# Patient Record
Sex: Female | Born: 1970 | Race: White | Hispanic: No | Smoking: Never smoker
Health system: Southern US, Community
[De-identification: ages and names within clinical notes are randomized; demographics above are authoritative.]

## PROBLEM LIST (undated history)

## (undated) DIAGNOSIS — F419 Anxiety disorder, unspecified: Secondary | ICD-10-CM

## (undated) DIAGNOSIS — I639 Cerebral infarction, unspecified: Secondary | ICD-10-CM

## (undated) DIAGNOSIS — I10 Essential (primary) hypertension: Secondary | ICD-10-CM

## (undated) HISTORY — PX: ABDOMINAL HYSTERECTOMY: SHX81

## (undated) HISTORY — PX: TONSILLECTOMY: SUR1361

## (undated) HISTORY — PX: LEEP: SHX91

## (undated) HISTORY — DX: Cerebral infarction, unspecified: I63.9

## (undated) HISTORY — PX: CHOLECYSTECTOMY: SHX55

## (undated) HISTORY — PX: FRACTURE SURGERY: SHX138

---

## 2012-02-17 ENCOUNTER — Ambulatory Visit: Payer: Self-pay

## 2013-04-21 ENCOUNTER — Ambulatory Visit: Payer: Self-pay

## 2013-07-06 ENCOUNTER — Ambulatory Visit: Payer: Self-pay | Admitting: Unknown Physician Specialty

## 2013-07-08 ENCOUNTER — Observation Stay: Payer: Self-pay | Admitting: Surgery

## 2013-07-08 LAB — PREGNANCY, URINE: Pregnancy Test, Urine: NEGATIVE m[IU]/mL

## 2013-07-12 LAB — PATHOLOGY REPORT

## 2014-04-25 ENCOUNTER — Ambulatory Visit: Payer: Self-pay

## 2014-05-05 DIAGNOSIS — B009 Herpesviral infection, unspecified: Secondary | ICD-10-CM

## 2014-05-05 DIAGNOSIS — E669 Obesity, unspecified: Secondary | ICD-10-CM | POA: Insufficient documentation

## 2014-05-05 HISTORY — DX: Herpesviral infection, unspecified: B00.9

## 2015-03-02 ENCOUNTER — Other Ambulatory Visit: Payer: Self-pay | Admitting: Obstetrics and Gynecology

## 2015-03-02 DIAGNOSIS — Z1231 Encounter for screening mammogram for malignant neoplasm of breast: Secondary | ICD-10-CM

## 2015-03-09 NOTE — Op Note (Signed)
PATIENT NAME:  Kristina [name], Kristina [name] MR#:  [id number] DATE OF BIRTH:  03-20-1971  DATE OF PROCEDURE:  07/08/2013  PREOPERATIVE DIAGNOSES: Acute cholecystitis, cholelithiasis.   POSTOPERATIVE DIAGNOSES:  Acute cholecystitis, cholelithiasis.    PROCEDURE: Laparoscopic cholecystectomy.   SURGEON: Renda RollsWilton Smith, M.D.   ANESTHESIA: General.   INDICATIONS: This 44 year old female came in with severe right upper quadrant abdominal pain, ultrasound findings of gallstones and thickened gallbladder wall. She had exquisite right upper quadrant tenderness and surgery was recommended for definitive treatment.   DESCRIPTION OF PROCEDURE: The patient was placed on the operating table in the supine position under general endotracheal anesthesia. The abdomen was prepared with ChloraPrep and draped in a sterile manner.   A short infraumbilical incision was made, carried down through subcutaneous tissues. The deep fascia was grasped with laryngeal hook and elevated. A Veress needle was inserted, aspirated and irrigated with a saline solution. Next, the peritoneal cavity was inflated with carbon dioxide. The Veress needle was removed. The 10 mm cannula was inserted. The 10 mm, 0 degree laparoscope was inserted to view the peritoneal cavity. Another incision was made in the epigastrium slightly to the right of the midline to introduce an 11 mm cannula. Two incisions were made in the lateral aspect of the right upper quadrant to introduce two 5 mm cannulas.   Initial inspection revealed there was marked distention of the gallbladder and it appeared tense. The liver itself appeared normal. There were no other gross abnormalities seen. There was just a trace of ascites. Next, the gallbladder was decompressed with a lancing needle, however, only a limited amount of black, thick bile came out and removed enough that the gallbladder could then be grasped with a grasper and was retracted towards the right shoulder. A number of acute  adhesions were taken down with blunt dissection. The pouch of Jon BillingsMorrison was retracted inferiorly and laterally. The porta hepatis was demonstrated. The cystic duct was dissected free from surrounding structures. It was somewhat large in size but could see it as it tapered off the gallbladder. The neck of the gallbladder was further mobilized with incision of the visceral peritoneum. The tissues were friable. Multiple small bleeding points were cauterized. The cystic artery was dissected free from surrounding structures. A critical view of safety was demonstrated. An Endo Clip was placed across the cystic duct adjacent to the neck of the gallbladder. An incision was made in the cystic duct to introduce a Reddick catheter, however, the catheter would not thread beyond some 6 mm.  The catheter was removed and I used a microdissector to milk the duct towards the incision and a small stone came out. Subsequently, another effort was made to insert the Reddick catheter, however, it would not thread. No additional stones could be milked out. It appeared that the cystic duct was obstructed and, therefore, cholangiogram was not done. The cystic duct was doubly ligated with Endo Clips and divided. The cystic artery was controlled with double Endo Clips and divided. The gallbladder was dissected, mobilizing it from the liver with a somewhat tedious dissection. There was another small bleeding point laterally located which was controlled with Endo Clip.  I continued, very tedious dissection was undertaken as the gallbladder was somewhat long and was markedly inflamed with thickened gallbladder wall. A smooth plane of dissection was followed. Multiple  tiny vessels were cauterized and about 2/3 of the way along the gallbladder bed did have some leakage of bile which was black in color and suctioned this  as it exuded from the gallbladder. Once this stopped draining, the gallbladder was grasped at this site with a grasper. Further  tedious dissection was carried out, cauterizing multiple tiny bleeding points, and eventually the gallbladder was completely separated from the liver. The site was irrigated with heparinized saline solution and aspirated. Hemostasis appeared to be intact. The gallbladder was placed into an Endo Catch bag and brought up to the umbilical incision.  It was necessary to lengthen the skin incision ultimately by about 1.5 cm and also lengthen the fascial incision sharply with a scalpel and with opening the bag and incising the gallbladder, additional black thick bile was aspirated. Stone forceps was used to remove a number of stones and with additional traction and manipulation, the gallbladder was removed still in the bag and submitted in formalin for routine pathology.   Next, a 0 Maxon figure-of-eight suture was placed in the fascia to make the opening smaller so that the 10 mm cannula was then reinserted. The laparoscope was inserted through this site, further insufflated the abdomen and then re-examined the operative site.  One tiny bleeding point was cauterized. Hemostasis otherwise appeared to be intact. The site was irrigated with heparinized saline multiple times until the return was clear and colorless. Hemostasis was intact. Next, the cannulas were removed allowing carbon dioxide to escape from the peritoneal cavity. The remainder of the fascial defect at the umbilicus was closed with another 0 Maxon figure-of-eight suture. The wounds were inspected. Hemostasis was intact. The wounds were closed with interrupted 5-0 chromic subcuticular suture, benzoin and Steri-Strips. Dressings were applied with paper tape. The patient tolerated surgery satisfactorily.   This case was significantly more difficult and required 2 hours of operating time and required more effort than the typical gallbladder operation due to the marked distention and friability and extent of size of the gallbladder. The patient was carried to  the recovery room for postoperative care.      ____________________________ J. Renda Rolls, MD jws:cs D: 07/08/2013 20:13:51 ET T: 07/08/2013 21:28:58 ET JOB#: [id number]  cc: Adella Hare, MD, <Dictator> Adella Hare MD ELECTRONICALLY SIGNED 07/12/2013 19:09

## 2015-03-09 NOTE — H&P (Signed)
PATIENT NAME:  Kristina [name], Kristina [name] MR#:  [id number] DATE OF BIRTH:  11-Aug-1971  DATE OF ADMISSION:  07/08/2013  HISTORY OF PRESENT ILLNESS:  This 44 year old female comes in with the chief complaint of right upper abdominal pain. She reports acute onset of pain 6 days ago which came on as an attack with severe pain radiating around to the right back. She had associated nausea. No vomiting. No chills or fever. She reports she had another attack 3 days ago, but has continued to hurt, has been taking hydrocodone, has had an ultrasound which demonstrated gallstones with a stone impacted in the neck of the gallbladder. Bile duct was normal size. She has been taking hydrocodone, which eases the pain somewhat but still hurting. Has been able to take some liquids and food. Has not had anything to eat today. She is moving slowly, in quite a bit of distress.   PAST MEDICAL HISTORY: She has no history of hepatitis. Does have history of herpes simplex infection. Has had LEEP procedure. Also has had NovaSure. Says if she has any menstrual period at all just lasts for about 10 minutes. She reports no history of heart disease. No history of any lung problems.   SOCIAL HISTORY: She is accompanied by her husband. Does not smoke. Does not drink any alcohol.   FAMILY HISTORY: Positive for diabetes, clotting and bleeding problems in her mother, although the patient has had no clotting or bleeding problems herself. Mother has had throat cancer.   REVIEW OF SYSTEMS: She reports no other recent acute illness such as cough, cold, sore throat. No chills or fever. No difficulty breathing. No chest pain, except with the present illness. Some discomfort in the chest. She reports she moves her bowels satisfactorily, voids satisfactorily. She has had no ankle edema. Has had no recent sores or boils. Review of systems otherwise negative.   PHYSICAL EXAMINATION: GENERAL: She is awake, alert and oriented, ambulatory, no acute  distress. VITAL SIGNS: Weight is 184, blood pressure 99/67, pulse 82.  SKIN: Warm and dry without rash or jaundice.  HEENT: Pupils equal and reactive to light. Extraocular movements are intact. Sclerae clear. Pharynx clear.  NECK: No palpable mass.  LUNGS: Sounds are clear.  HEART: Regular rhythm, S1 and S2.  ABDOMEN: Exquisite right upper quadrant tenderness. No palpable mass. No lower abdominal tenderness.  EXTREMITIES: No dependent edema.  NEUROLOGIC: Awake, alert and oriented, moving all extremities.   IMPRESSION: Acute cholecystitis.   RECOMMENDATIONS: I recommend we do CBC, Met-B, liver panel, admit her to observation bed in the hospital, recommend laparoscopic cholecystectomy. I have discussed the operation, care, risks and benefits with her in detail information, reviewed with her patient information and illustration booklet.  So we are going to arrange for the observation bed, see if we get the surgery scheduled later today. ____________________________ J. Rochel Brome, MD jws:sb D: 07/08/2013 08:38:53 ET T: 07/08/2013 09:14:29 ET JOB#: [id number]  cc: Loreli Dollar, MD, <Dictator> (FAX: [phone]) Loreli Dollar MD ELECTRONICALLY SIGNED 07/08/2013 20:58

## 2015-04-03 NOTE — H&P (Signed)
  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 LEEP in 2011 and recurrence on pap 2016 shows LGSIL , cannot r/out HGSIL .A repeat colpo and bx show CIN1. 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  Past Surgical History:  has past surgical history that includes ankle reconstruction (1993); Cervical biopsy w/ loop electrode excision; Endometrial ablation w/ novasure; Cholecystectomy; NovaSure procedure (02/21/08); and LEEP procedure. Family History: family history includes Breast cancer in her other; Diabetes mellitus in her father. Social History:  reports that she has never smoked. She does not have any smokeless tobacco history on file. She reports that she does not drink alcohol or use illicit drugs. OB/GYN History:  OB History    Gravida Para Term Preterm AB TAB SAB Ectopic Multiple Living   4 4 4       4       Allergies: has No Known Allergies. Medications:  Current outpatient prescriptions:  . phentermine (ADIPEX-P) 37.5 MG capsule, Take 1 capsule (37.5 mg total) by mouth every morning before breakfast., Disp: 30 capsule, Rfl: 0 . valACYclovir (VALTREX) 500 MG tablet, Take 1 tablet (500 mg total) by mouth once daily., Disp: 30 tablet, Rfl: 3  Review of Systems: General: No fatigue or weight loss Eyes:No vision changes Ears:No hearing difficulty Respiratory: No cough or shortness of breath Pulmonary: No asthma or shortness of breath Cardiovascular: No chest pain, palpitations, dyspnea on exertion Gastrointestinal: No abdominal bloating, chronic diarrhea, constipations, masses, pain or hematochezia Genitourinary:No hematuria, dysuria, abnormal vaginal  discharge, pelvic pain, Menometrorrhagia Lymphatic:No swollen lymph nodes Musculoskeletal:No muscle weakness Neurologic:No extremity weakness, syncope, seizure disorder Psychiatric:No history of depression, delusions or suicidal/homicidal ideation   Exam:   Filed Vitals:   01/22/15 1658  BP: 113/76  Pulse: 84    Body mass index is 28.9 kg/(m^2).   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 [facility] cons of tvh discussed with the pt. She is aware of potential organ injury , risks of blood transfusion and the potential for infectious diseases including hepatitis , HIV , and cmv if blood is given Risks of DVT / PE and infection were discussed . Pt understands and wishes to proceed  . All questions answered .      Vilma PraderHOMAS JANSE Kristina Bullard, MD            Facility Information    [facility]   [address] [place] [id number]    Service Location

## 2015-04-05 ENCOUNTER — Encounter
Admission: RE | Admit: 2015-04-05 | Discharge: 2015-04-05 | Disposition: A | Discharge status: Home / Self Care | Payer: BC Managed Care – PPO | Source: Ambulatory Visit | Attending: Obstetrics and Gynecology | Admitting: Obstetrics and Gynecology

## 2015-04-05 DIAGNOSIS — Z79899 Other long term (current) drug therapy: Secondary | ICD-10-CM | POA: Diagnosis not present

## 2015-04-05 DIAGNOSIS — E669 Obesity, unspecified: Secondary | ICD-10-CM | POA: Insufficient documentation

## 2015-04-05 DIAGNOSIS — Z9889 Other specified postprocedural states: Secondary | ICD-10-CM | POA: Diagnosis not present

## 2015-04-05 DIAGNOSIS — Z6828 Body mass index (BMI) 28.0-28.9, adult: Secondary | ICD-10-CM | POA: Diagnosis not present

## 2015-04-05 DIAGNOSIS — Z803 Family history of malignant neoplasm of breast: Secondary | ICD-10-CM | POA: Diagnosis not present

## 2015-04-05 DIAGNOSIS — N879 Dysplasia of cervix uteri, unspecified: Secondary | ICD-10-CM | POA: Diagnosis present

## 2015-04-05 DIAGNOSIS — Z01812 Encounter for preprocedural laboratory examination: Secondary | ICD-10-CM | POA: Diagnosis not present

## 2015-04-05 DIAGNOSIS — N870 Mild cervical dysplasia: Secondary | ICD-10-CM | POA: Diagnosis not present

## 2015-04-05 DIAGNOSIS — Z833 Family history of diabetes mellitus: Secondary | ICD-10-CM | POA: Diagnosis not present

## 2015-04-05 LAB — BASIC METABOLIC PANEL
Anion gap: 6 (ref 5–15)
BUN: 11 mg/dL (ref 6–20)
CO2: 26 mmol/L (ref 22–32)
Calcium: 8.8 mg/dL — ABNORMAL LOW (ref 8.9–10.3)
Chloride: 105 mmol/L (ref 101–111)
Creatinine, Ser: 0.64 mg/dL (ref 0.44–1.00)
GFR calc Af Amer: >60 mL/min (ref >60)
GLUCOSE: 94 mg/dL (ref 65–99)
Potassium: 4.1 mmol/L (ref 3.5–5.1)
Sodium: 137 mmol/L (ref 135–145)

## 2015-04-05 LAB — HEMOGLOBIN: Hemoglobin: 12.3 g/dL (ref 12.0–16.0)

## 2015-04-05 LAB — ABO/RH: ABO/RH(D): A NEG

## 2015-04-05 LAB — TYPE AND SCREEN
ABO/RH(D): A NEG
ANTIBODY SCREEN: NEGATIVE

## 2015-04-05 NOTE — Patient Instructions (Signed)
  Your procedure is scheduled on: Apr 13, 2015 (Friday) Report to Same Day Surgery. To find out your arrival time please call [phone] between 1PM - 3PM on Apr 12, 2015 (Thursday) Remember: Instructions that are not followed completely may result in serious medical risk, up to and including death, or upon the discretion of your surgeon and anesthesiologist your surgery may need to be rescheduled.    ___x_ 1. Do not eat food or drink liquids after midnight. No gum chewing or hard candies.     ____ 2. No Alcohol for 24 hours before or after surgery.   ____ 3. Bring all medications with you on the day of surgery if instructed.    __x__ 4. Notify your doctor if there is any change in your medical condition     (cold, fever, infections).     Do not wear jewelry, make-up, hairpins, clips or nail polish.  Do not wear lotions, powders, or perfumes. You may wear deodorant.  Do not shave 48 hours prior to surgery. Men may shave face and neck.  Do not bring valuables to the hospital.    [facility] is not responsible for any belongings or valuables.               Contacts, dentures or bridgework may not be worn into surgery.  Leave your suitcase in the car. After surgery it may be brought to your room.  For patients admitted to the hospital, discharge time is determined by your                treatment team.   Patients discharged the day of surgery will not be allowed to drive home.   Please read over the following fact sheets that you were given:   Surgical Site Infection Prevention   ____ Take these medicines the morning of surgery with A SIP OF WATER:    1.  2.   3.   4.  5.  6.  __x__ Fleet Enema (as directed)   __x__ Use CHG Soap as directed  ____ Use inhalers on the day of surgery  ____ Stop metformin 2 days prior to surgery    ____ Take 1/2 of usual insulin dose the night before surgery and none on the morning of surgery.   ____ Stop Coumadin/Plavix/aspirin on    ____ Stop Anti-inflammatories on    ____ Stop supplements until after surgery.    ____ Bring C-Pap to the hospital.

## 2015-04-13 ENCOUNTER — Observation Stay
Admission: RE | Admit: 2015-04-13 | Discharge: 2015-04-14 | Disposition: A | Discharge status: Home / Self Care | Payer: BC Managed Care – PPO | Source: Ambulatory Visit | Attending: Obstetrics and Gynecology | Admitting: Obstetrics and Gynecology

## 2015-04-13 ENCOUNTER — Ambulatory Visit: Payer: BC Managed Care – PPO | Admitting: Anesthesiology

## 2015-04-13 ENCOUNTER — Encounter
Admission: RE | Disposition: A | Discharge status: Home / Self Care | Payer: Self-pay | Source: Ambulatory Visit | Attending: Obstetrics and Gynecology

## 2015-04-13 DIAGNOSIS — Z9889 Other specified postprocedural states: Secondary | ICD-10-CM | POA: Insufficient documentation

## 2015-04-13 DIAGNOSIS — N870 Mild cervical dysplasia: Secondary | ICD-10-CM | POA: Diagnosis not present

## 2015-04-13 DIAGNOSIS — Z833 Family history of diabetes mellitus: Secondary | ICD-10-CM | POA: Insufficient documentation

## 2015-04-13 DIAGNOSIS — Z803 Family history of malignant neoplasm of breast: Secondary | ICD-10-CM | POA: Insufficient documentation

## 2015-04-13 DIAGNOSIS — E669 Obesity, unspecified: Secondary | ICD-10-CM | POA: Insufficient documentation

## 2015-04-13 DIAGNOSIS — Z6828 Body mass index (BMI) 28.0-28.9, adult: Secondary | ICD-10-CM | POA: Insufficient documentation

## 2015-04-13 DIAGNOSIS — Z79899 Other long term (current) drug therapy: Secondary | ICD-10-CM | POA: Insufficient documentation

## 2015-04-13 HISTORY — PX: SALPINGOOPHORECTOMY: SHX82

## 2015-04-13 HISTORY — PX: VAGINAL HYSTERECTOMY: SHX2639

## 2015-04-13 LAB — POCT PREGNANCY, URINE: PREG TEST UR: NEGATIVE

## 2015-04-13 SURGERY — HYSTERECTOMY, VAGINAL
Anesthesia: General

## 2015-04-13 MED ORDER — ACETAMINOPHEN 10 MG/ML IV SOLN
INTRAVENOUS | Status: DC | PRN
  Administered 2015-04-13: 1000 mg via INTRAVENOUS

## 2015-04-13 MED ORDER — KETOROLAC TROMETHAMINE 30 MG/ML IJ SOLN
30.0000 mg | Freq: Three times a day (TID) | INTRAMUSCULAR | Status: DC
  Administered 2015-04-13 (×2): 30 mg via INTRAVENOUS
  Filled 2015-04-13 (×2): qty 1

## 2015-04-13 MED ORDER — MORPHINE SULFATE 2 MG/ML IJ SOLN
1.0000 mg | INTRAMUSCULAR | Status: DC | PRN
  Administered 2015-04-13 (×3): 2 mg via INTRAVENOUS
  Filled 2015-04-13 (×3): qty 1

## 2015-04-13 MED ORDER — ONDANSETRON HCL 4 MG/2ML IJ SOLN
INTRAMUSCULAR | Status: DC | PRN
  Administered 2015-04-13: 4 mg via INTRAVENOUS

## 2015-04-13 MED ORDER — LIDOCAINE HCL (CARDIAC) 20 MG/ML IV SOLN
INTRAVENOUS | Status: DC | PRN
  Administered 2015-04-13: 100 mg via INTRAVENOUS

## 2015-04-13 MED ORDER — ACETAMINOPHEN 10 MG/ML IV SOLN
INTRAVENOUS | Status: AC
  Filled 2015-04-13: qty 100

## 2015-04-13 MED ORDER — FENTANYL CITRATE (PF) 100 MCG/2ML IJ SOLN
INTRAMUSCULAR | Status: AC
  Administered 2015-04-13: 25 ug via INTRAVENOUS
  Filled 2015-04-13: qty 2

## 2015-04-13 MED ORDER — ROCURONIUM BROMIDE 100 MG/10ML IV SOLN
INTRAVENOUS | Status: DC | PRN
  Administered 2015-04-13: 40 mg via INTRAVENOUS
  Administered 2015-04-13: 10 mg via INTRAVENOUS

## 2015-04-13 MED ORDER — MIDAZOLAM HCL 2 MG/2ML IJ SOLN
INTRAMUSCULAR | Status: DC | PRN
  Administered 2015-04-13: 2 mg via INTRAVENOUS

## 2015-04-13 MED ORDER — ONDANSETRON HCL 4 MG/2ML IJ SOLN: 4.0000 mg | Freq: Once | INTRAMUSCULAR | Status: DC | PRN

## 2015-04-13 MED ORDER — LACTATED RINGERS IV SOLN
INTRAVENOUS | Status: DC
  Administered 2015-04-13 (×2): via INTRAVENOUS

## 2015-04-13 MED ORDER — POTASSIUM CHLORIDE 2 MEQ/ML IV SOLN
INTRAVENOUS | Status: DC
  Administered 2015-04-13: 13:00:00 via INTRAVENOUS
  Filled 2015-04-13 (×6): qty 1000

## 2015-04-13 MED ORDER — KETOROLAC TROMETHAMINE 30 MG/ML IJ SOLN
INTRAMUSCULAR | Status: DC | PRN
Start: 2015-04-13 — End: 2015-04-13

## 2015-04-13 MED ORDER — OXYCODONE-ACETAMINOPHEN 5-325 MG PO TABS
2.0000 | ORAL_TABLET | ORAL | Status: DC | PRN
  Administered 2015-04-13 – 2015-04-14 (×5): 2 via ORAL
  Filled 2015-04-13 (×6): qty 2

## 2015-04-13 MED ORDER — CEFOXITIN SODIUM-DEXTROSE 2-2.2 GM-% IV SOLR (PREMIX)
INTRAVENOUS | Status: AC
  Filled 2015-04-13: qty 50

## 2015-04-13 MED ORDER — CEFOXITIN SODIUM-DEXTROSE 2-2.2 GM-% IV SOLR (PREMIX)
2.0000 g | INTRAVENOUS | Status: AC
  Administered 2015-04-13: 2000 mg via INTRAVENOUS

## 2015-04-13 MED ORDER — FENTANYL CITRATE (PF) 100 MCG/2ML IJ SOLN
25.0000 ug | INTRAMUSCULAR | Status: DC | PRN
  Administered 2015-04-13 (×4): 25 ug via INTRAVENOUS

## 2015-04-13 MED ORDER — LIDOCAINE-EPINEPHRINE 1 %-1:100000 IJ SOLN
INTRAMUSCULAR | Status: DC | PRN
Start: 2015-04-13 — End: 2015-04-13
  Administered 2015-04-13: 10 mL

## 2015-04-13 MED ORDER — LACTATED RINGERS IV SOLN
INTRAVENOUS | Status: DC
  Administered 2015-04-13: 07:00:00 via INTRAVENOUS

## 2015-04-13 MED ORDER — DEXAMETHASONE SODIUM PHOSPHATE 4 MG/ML IJ SOLN
INTRAMUSCULAR | Status: DC | PRN
  Administered 2015-04-13: 10 mg via INTRAVENOUS

## 2015-04-13 MED ORDER — PROPOFOL 10 MG/ML IV BOLUS
INTRAVENOUS | Status: DC | PRN
  Administered 2015-04-13: 160 mg via INTRAVENOUS
  Administered 2015-04-13: 40 mg via INTRAVENOUS

## 2015-04-13 MED ORDER — SUGAMMADEX SODIUM 200 MG/2ML IV SOLN
INTRAVENOUS | Status: DC | PRN
  Administered 2015-04-13: 180.6 mg via INTRAVENOUS

## 2015-04-13 MED ORDER — PROMETHAZINE HCL 25 MG/ML IJ SOLN: 25.0000 mg | INTRAMUSCULAR | Status: DC | PRN

## 2015-04-13 MED ORDER — PHENYLEPHRINE HCL 10 MG/ML IJ SOLN
INTRAMUSCULAR | Status: DC | PRN
  Administered 2015-04-13: 100 ug via INTRAVENOUS

## 2015-04-13 MED ORDER — LIDOCAINE-EPINEPHRINE 1 %-1:100000 IJ SOLN
INTRAMUSCULAR | Status: AC
  Filled 2015-04-13: qty 1

## 2015-04-13 MED ORDER — FENTANYL CITRATE (PF) 100 MCG/2ML IJ SOLN
INTRAMUSCULAR | Status: DC | PRN
  Administered 2015-04-13: 100 ug via INTRAVENOUS
  Administered 2015-04-13 (×3): 50 ug via INTRAVENOUS

## 2015-04-13 MED ORDER — HYDROCODONE-ACETAMINOPHEN 5-325 MG PO TABS
1.0000 | ORAL_TABLET | ORAL | Status: DC | PRN
Start: 2015-04-13 — End: 2015-04-13
  Administered 2015-04-13: 2 via ORAL
  Filled 2015-04-13: qty 2

## 2015-04-13 MED ORDER — KETOROLAC TROMETHAMINE 30 MG/ML IJ SOLN
INTRAMUSCULAR | Status: DC | PRN
  Administered 2015-04-13: 30 mg via INTRAVENOUS

## 2015-04-13 MED ORDER — FENTANYL CITRATE (PF) 100 MCG/2ML IJ SOLN
INTRAMUSCULAR | Status: AC
  Filled 2015-04-13: qty 2

## 2015-04-13 SURGICAL SUPPLY — 45 items
BAG URO DRAIN 2000ML W/SPOUT (MISCELLANEOUS) ×4 IMPLANT
CANISTER SUCT 1200ML W/VALVE (MISCELLANEOUS) ×4 IMPLANT
CATH FOLEY 2WAY  5CC 16FR (CATHETERS) ×2
CATH ROBINSON RED A/P 16FR (CATHETERS) ×4 IMPLANT
CATH TRAY 16F METER LATEX (MISCELLANEOUS) ×4 IMPLANT
CATH URTH 16FR FL 2W BLN LF (CATHETERS) ×2 IMPLANT
DISSECTOR KITTNER STICK (MISCELLANEOUS) IMPLANT
DISSECTORS/KITTNER STICK (MISCELLANEOUS)
DRAPE LAPAROTOMY TRNSV 106X77 (MISCELLANEOUS) IMPLANT
DRAPE PERI LITHO V/GYN (MISCELLANEOUS) ×4 IMPLANT
DRAPE SURG 17X11 SM STRL (DRAPES) ×8 IMPLANT
DRAPE UNDER BUTTOCK W/FLU (DRAPES) ×4 IMPLANT
DRSG TELFA 3X8 NADH (GAUZE/BANDAGES/DRESSINGS) ×4 IMPLANT
ELECT BLADE 6 FLAT ULTRCLN (ELECTRODE) ×4 IMPLANT
GLOVE BIO SURGEON STRL SZ8 (GLOVE) ×16 IMPLANT
GLOVE INDICATOR 8.0 STRL GRN (GLOVE) ×4 IMPLANT
GOWN STRL REUS W/ TWL LRG LVL3 (GOWN DISPOSABLE) ×6 IMPLANT
GOWN STRL REUS W/ TWL XL LVL3 (GOWN DISPOSABLE) ×2 IMPLANT
GOWN STRL REUS W/TWL LRG LVL3 (GOWN DISPOSABLE) ×6
GOWN STRL REUS W/TWL XL LVL3 (GOWN DISPOSABLE) ×2
IV NS 1000ML (IV SOLUTION) ×2
IV NS 1000ML BAXH (IV SOLUTION) ×2 IMPLANT
KIT RM TURNOVER CYSTO AR (KITS) ×4 IMPLANT
LABEL OR SOLS (LABEL) ×4 IMPLANT
NDL SAFETY 22GX1.5 (NEEDLE) ×4 IMPLANT
PACK BASIN MAJOR ARMC (MISCELLANEOUS) ×4 IMPLANT
PACK BASIN MINOR ARMC (MISCELLANEOUS) ×4 IMPLANT
PAD GROUND ADULT SPLIT (MISCELLANEOUS) ×4 IMPLANT
PAD OB MATERNITY 4.3X12.25 (PERSONAL CARE ITEMS) ×4 IMPLANT
PAD PREP 24X41 OB/GYN DISP (PERSONAL CARE ITEMS) ×4 IMPLANT
SPONGE XRAY 4X4 16PLY STRL (MISCELLANEOUS) ×4 IMPLANT
STAPLER SKIN PROX 35W (STAPLE) ×4 IMPLANT
SUT PDS 2-0 27IN (SUTURE) ×4 IMPLANT
SUT VIC AB 0 CT1 27 (SUTURE) ×4
SUT VIC AB 0 CT1 27XCR 8 STRN (SUTURE) ×4 IMPLANT
SUT VIC AB 0 CT1 36 (SUTURE) ×4 IMPLANT
SUT VIC AB 2-0 CT1 (SUTURE) ×4 IMPLANT
SUT VIC AB 2-0 SH 27 (SUTURE)
SUT VIC AB 2-0 SH 27XBRD (SUTURE) IMPLANT
SUT VICRYL PLUS ABS 0 54 (SUTURE) IMPLANT
SYR BULB IRRIG 60ML STRL (SYRINGE) ×4 IMPLANT
SYR CONTROL 10ML (SYRINGE) ×4 IMPLANT
SYRINGE 10CC LL (SYRINGE) ×4 IMPLANT
TRAY PREP VAG/GEN (MISCELLANEOUS) ×4 IMPLANT
WATER STERILE IRR 1000ML POUR (IV SOLUTION) ×4 IMPLANT

## 2015-04-13 NOTE — Progress Notes (Signed)
Pt seen  Today . No change in history . Hemoglobin 12  , negative HCG .  NPO . Pt ready for TVH possible salpingectomy

## 2015-04-13 NOTE — Progress Notes (Signed)
Day of Surgery Procedure(s) (LRB): HYSTERECTOMY VAGINAL (N/A) SALPINGectomy  Subjective: Patient reports some LBP and pelvic pain  Objective: vss   good urine output.  General: alert and cooperative  Assessment: Clinically stable   Plan: D/c IV  Start regular diet  Foley already out    LOS: 0 days    Kristina [name] 04/13/2015, 6:54 PM

## 2015-04-13 NOTE — Progress Notes (Signed)
Nurse has attempted to control lower back pain with norco, morphine, k-pad, and percocet.  Patient continues to complain of pain.  Patient is alert and oriented sitting up in bed with talking with family member and does not appear to be in distress.  No facial grimacing, grunting, or tension noted.  Dr. Arvella MerlesSchemerhorn paged for further orders.

## 2015-04-13 NOTE — Anesthesia Procedure Notes (Signed)
Procedure Name: Intubation Date/Time: 04/13/2015 7:43 AM Performed by: Stormy FabianURTIS, Jadie Comas Pre-anesthesia Checklist: Patient identified, Patient being monitored, Timeout performed, Emergency Drugs available and Suction available Patient Re-evaluated:Patient Re-evaluated prior to inductionOxygen Delivery Method: Circle system utilized Preoxygenation: Pre-oxygenation with 100% oxygen Intubation Type: IV induction Ventilation: Mask ventilation without difficulty Laryngoscope Size: Mac and 3 Grade View: Grade I Tube type: Oral Tube size: 7.0 mm Number of attempts: 1 Airway Equipment and Method: Stylet Placement Confirmation: ETT inserted through vocal cords under direct vision,  positive ETCO2 and breath sounds checked- equal and bilateral Secured at: 20 cm Tube secured with: Tape Dental Injury: Teeth and Oropharynx as per pre-operative assessment

## 2015-04-13 NOTE — Brief Op Note (Signed)
04/13/2015  8:43 AM  PATIENT:  Kristina [name]  44 y.o. female  PRE-OPERATIVE DIAGNOSIS:  persistant cervical dysplasia  POST-OPERATIVE DIAGNOSIS:  same as preop  PROCEDURE:  Procedure(s): HYSTERECTOMY VAGINAL (N/A) SALPINGO OOPHORECTOMY (Bilateral)  SURGEON:  Surgeon(s) and Role:    Suzy Bouchard* Thomas J Schermerhorn, MD - Primary  PHYSICIAN ASSISTANT: beasley  ASSISTANTS: none   ANESTHESIA:   general  EBL:  Total I/O In: -  Out: 450 [Urine:400; Blood:50]  IOF 800 cc  BLOOD ADMINISTERED:none  DRAINS: none   LOCAL MEDICATIONS USED:  LIDOCAINE   SPECIMEN: uterus and bilat tubes  DISPOSITION OF SPECIMEN:  PATHOLOGY  COUNTS:  YES  TOURNIQUET:  * No tourniquets in log *  DICTATION: verbal  PLAN OF CARE: Admit for overnight observation  PATIENT DISPOSITION:  PACU - hemodynamically stable.   Delay start of Pharmacological VTE agent (>24hrs) due to surgical blood loss or risk of bleeding: not applicable

## 2015-04-13 NOTE — Transfer of Care (Signed)
Immediate Anesthesia Transfer of Care Note  Patient: Kristina [name]  Procedure(s) Performed: Procedure(s): HYSTERECTOMY VAGINAL (N/A) SALPINGO OOPHORECTOMY (Bilateral)  Patient Location: PACU  Anesthesia Type:General  Level of Consciousness: sedated  Airway & Oxygen Therapy: Patient Spontanous Breathing and Patient connected to face mask oxygen  Post-op Assessment: Report given to RN and Post -op Vital signs reviewed and stable  Post vital signs: Reviewed and stable  Last Vitals:  Filed Vitals:   04/13/15 0901  BP: 109/70  Pulse: 81  Temp: 36.7 C  Resp: 12    Complications: No apparent anesthesia complications

## 2015-04-13 NOTE — Anesthesia Postprocedure Evaluation (Signed)
  Anesthesia Post-op Note  Patient: Kristina [name]  Procedure(s) Performed: Procedure(s): HYSTERECTOMY VAGINAL (N/A) SALPINGO OOPHORECTOMY (Bilateral)  Anesthesia type:General  Patient location: PACU  Post pain: Pain level controlled  Post assessment: Post-op Vital signs reviewed, Patient's Cardiovascular Status Stable, Respiratory Function Stable, Patent Airway and No signs of Nausea or vomiting  Post vital signs: Reviewed and stable  Last Vitals:  Filed Vitals:   04/13/15 0914  BP:   Pulse: 85  Temp:   Resp: 20    Level of consciousness: awake, alert  and patient cooperative  Complications: No apparent anesthesia complications

## 2015-04-13 NOTE — Anesthesia Preprocedure Evaluation (Signed)
Anesthesia Evaluation  Patient identified by MRN, date of birth, ID band Patient awake    Reviewed: Allergy & Precautions, NPO status , Patient's Chart, lab work & pertinent test results  Airway Mallampati: II  TM Distance: >3 FB Neck ROM: Full    Dental  (+) Chipped   Pulmonary          Cardiovascular     Neuro/Psych    GI/Hepatic   Endo/Other    Renal/GU      Musculoskeletal   Abdominal   Peds  Hematology   Anesthesia Other Findings   Reproductive/Obstetrics                             Anesthesia Physical Anesthesia Plan  ASA: II  Anesthesia Plan: General   Post-op Pain Management:    Induction: Intravenous  Airway Management Planned: Oral ETT  Additional Equipment:   Intra-op Plan:   Post-operative Plan:   Informed Consent: I have reviewed the patients History and Physical, chart, labs and discussed the procedure including the risks, benefits and alternatives for the proposed anesthesia with the patient or authorized representative who has indicated his/her understanding and acceptance.     Plan Discussed with: CRNA  Anesthesia Plan Comments:         Anesthesia Quick Evaluation  

## 2015-04-14 DIAGNOSIS — N870 Mild cervical dysplasia: Secondary | ICD-10-CM | POA: Diagnosis not present

## 2015-04-14 LAB — BASIC METABOLIC PANEL
ANION GAP: 5 (ref 5–15)
BUN: 12 mg/dL (ref 6–20)
CALCIUM: 8.3 mg/dL — AB (ref 8.9–10.3)
CO2: 22 mmol/L (ref 22–32)
CREATININE: 0.59 mg/dL (ref 0.44–1.00)
Chloride: 109 mmol/L (ref 101–111)
GFR calc Af Amer: >60 mL/min (ref >60)
GLUCOSE: 116 mg/dL — AB (ref 65–99)
Potassium: 3.5 mmol/L (ref 3.5–5.1)
SODIUM: 136 mmol/L (ref 135–145)

## 2015-04-14 LAB — CBC
HCT: 31.8 % — ABNORMAL LOW (ref 35.0–47.0)
HEMOGLOBIN: 10.9 g/dL — AB (ref 12.0–16.0)
MCH: 31.8 pg (ref 26.0–34.0)
MCHC: 34.1 g/dL (ref 32.0–36.0)
MCV: 93.2 fL (ref 80.0–100.0)
Platelets: 186 10*3/uL (ref 150–440)
RBC: 3.41 MIL/uL — ABNORMAL LOW (ref 3.80–5.20)
RDW: 13.2 % (ref 11.5–14.5)
WBC: 9.4 10*3/uL (ref 3.6–11.0)

## 2015-04-14 MED ORDER — OXYCODONE-ACETAMINOPHEN 5-325 MG PO TABS: 2.0000 | ORAL_TABLET | ORAL | Status: AC | PRN

## 2015-04-14 MED ORDER — NAPROXEN 500 MG PO TABS: 500.0000 mg | ORAL_TABLET | Freq: Two times a day (BID) | ORAL | Status: DC

## 2015-04-14 MED ORDER — DOCUSATE SODIUM 50 MG PO CAPS: 50.0000 mg | ORAL_CAPSULE | Freq: Two times a day (BID) | ORAL | Status: AC

## 2015-04-14 NOTE — Progress Notes (Signed)
Discharge instructions given to pt with verbal acknowledgment of understanding. Once given pt refused to be wheeled down and ambulated self off unit.

## 2015-04-14 NOTE — Discharge Instructions (Signed)
Histerectoma abdominal, cuidados posteriores (Abdominal Hysterectomy, Care After) Siga estas instrucciones durante las prximas semanas. Estas indicaciones le proporcionan informacin general acerca de cmo deber cuidarse despus del procedimiento. El mdico tambin podr darle instrucciones ms especficas. El tratamiento se ha planificado de acuerdo a las prcticas mdicas actuales, pero a veces se producen problemas. Comunquese con el mdico si tiene algn problema o tiene dudas despus del procedimiento.  QU ESPERAR DESPUS DEL PROCEDIMIENTO Despus del procedimiento, es normal tener los siguientes sntomas:  Dolor.  Cansancio.  Prdida del apetito.  Menos inters sexual. INSTRUCCIONES PARA EL CUIDADO EN EL HOGAR  La recuperacin de esta ciruga demora de 4a 6semanas. Asegrese de seguir todas las indicaciones del mdico. Las instrucciones para el cuidado en el hogar pueden incluir:  Tome los medicamentos para calmar el dolor solamente como se lo indic el mdico. No tome medicamentos de venta libre para calmar el dolor sin consultar antes al mdico.  Cambie el vendaje como se lo indic el mdico.  Debe ver nuevamente al mdico para que le saque las suturas.  Tome duchas en lugar de baos durante 2 a 3 semanas. Pregntele al mdico cundo es seguro comenzar a tomar duchas.  No se haga duchas vaginales, no use tampones ni tenga relaciones sexuales durante al menos 6semanas o hasta que el mdico la autorice.  Siga las indicaciones del mdico con respecto a la actividad fsica, a levantar objetos, a conducir el automvil y a las actividades en general.  Descanse y duerma lo suficiente.  No levante nada que sea ms pesado que un galn de leche (alrededor de 10lb [4.5kg]) durante el primer mes posterior a la ciruga.  Puede retomar su dieta normal si el mdico la autoriza.  No beba alcohol hasta que el mdico se lo autorice.  Si tiene estreimiento, pregntele al mdico si  puede tomar un laxante suave.  Los alimentos con alto contenido de fibra tambin pueden tener un efecto laxante. Coma muchas frutas y vegetales crudos, cereales integrales y frijoles.  Beba suficiente lquido para mantener la orina clara o de color amarillo plido.  Trate de que haya alguna persona en su casa para ayudarla con las tareas del hogar durante 1 a 2 semanas despus de la ciruga.  Cumpla con todas las visitas de control. SOLICITE ATENCIN MDICA SI:   Siente escalofros o tiene fiebre.  Tiene sntomas de hinchazn, enrojecimiento o dolor en el rea de la incisin que estn empeorando.  Tiene pus en el lugar de la incisin.  Advierte un olor ftido que proviene de la incisin o del vendaje.  La herida se abre.  Se siente mareada o sufre un desmayo.  Siente dolor o tiene una hemorragia al orinar.  Tiene diarrea persistente.  Tiene nuseas o vmitos persistentes.  Tiene flujo vaginal anormal.  Tiene una erupcin cutnea.  Tiene alguna reaccin anormal o aparece una alergia por los medicamentos.  El medicamento no le calma el dolor. SOLICITE ATENCIN MDICA DE INMEDIATO SI:   Tiene fiebre y los sntomas empeoran repentinamente.  Siente un dolor abdominal intenso.  Siente dolor en el pecho.  Le falta el aire.  Se desmaya.  Siente dolor, u observa hinchazn o enrojecimiento en la pierna.  Tiene una hemorragia vaginal abundante, con cogulos. ASEGRESE DE QUE:  Comprende estas instrucciones.  Controlar su afeccin.  Recibir ayuda de inmediato si no mejora o si empeora. Document Released: 05/17/2007 Document Revised: 11/08/2013 [employer] Patient Information 2015 [employer]. This information is not intended to replace   advice given to you by your health care provider. Make sure you discuss any questions you have with your health care provider.  

## 2015-04-14 NOTE — Discharge Summary (Signed)
Physician Discharge Summary  Patient ID: Kristina MachoStacey E [name] MRN: [id number] DOB/AGE: 11-28-1970 44 y.o.  Admit date: 04/13/2015 Discharge date: 04/14/2015  Admission Diagnoses:  Discharge Diagnoses:  Active Problems:   Post-operative state   Discharged Condition: good  Hospital Course: underwent an uncomplicated TVH and bilateral salpingectomy. Postop course uncomplicated , labs  HCT 31% and bun /cr 11/0.5  Consults: None  Significant Diagnostic Studies: none  Treatments: surgery: TVH , bilat salpingectomy  Discharge Exam: Blood pressure 98/47, pulse 86, temperature 98.1 F (36.7 C), temperature source Oral, resp. rate 16, height 5\' 8"  (1.727 m), weight 90.266 kg (199 lb), last menstrual period 03/28/2015, SpO2 100 %. WDWN  Lungs cta  cv rrr  adb soft nt Perineum pad limited d/c    Disposition: d/c to home      Medication List    TAKE these medications        docusate sodium 50 MG capsule  Commonly known as:  COLACE  Take 1 capsule (50 mg total) by mouth 2 (two) times daily.     naproxen 500 MG tablet  Commonly known as:  NAPROSYN  Take 1 tablet (500 mg total) by mouth 2 (two) times daily with a meal.     oxyCODONE-acetaminophen 5-325 MG per tablet  Commonly known as:  PERCOCET/ROXICET  Take 2 tablets by mouth every 4 (four) hours as needed for moderate pain.      ASK your doctor about these medications        valACYclovir 500 MG tablet  Commonly known as:  VALTREX  Take 1 tablet by mouth daily as needed. Fever blisters           Follow-up Information    Follow up with SCHERMERHORN,THOMAS, MD In 2 weeks.   Specialty:  Obstetrics and Gynecology   Why:  For wound re-check   Contact information:   [address] [place] [address] [phone]       Signed: Jennell CornerSCHERMERHORN,THOMAS 04/14/2015, 11:44 AM

## 2015-04-14 NOTE — Progress Notes (Signed)
Patients belongings retrieved and returned to patient from PACU.

## 2015-04-17 NOTE — Op Note (Signed)
Kristina [name]:  [name], Kristina                 ACCOUNT NO.:  [id number]  MEDICAL RECORD NO.:  [id number]  LOCATION:  331A                         FACILITY:  [facility]  PHYSICIAN:  Jennell Cornerhomas Schermerhorn, MDDATE OF BIRTH:  03-27-1971  DATE OF PROCEDURE:  04/13/2015 DATE OF DISCHARGE:  04/14/2015                              OPERATIVE REPORT   PREOPERATIVE DIAGNOSIS:  Persistent cervical dysplasia.  POSTOPERATIVE DIAGNOSIS:  Persistent cervical dysplasia.  PROCEDURE PERFORMED:  Total vaginal hysterectomy, bilateral salpingectomies.  SURGEON:  Jennell Cornerhomas Schermerhorn, MD  FIRST ASSISTANT:  Christeen DouglasBethany Beasley, M.D.  ANESTHESIA:  General endotracheal anesthesia.  INDICATIONS FOR PROCEDURE:  This is a 44 year old gravida 4, para 4 patient with persistent cervical dysplasia, status post a cervical LEEP in 2011, with recurrence on Pap smear in 2016.  The patient elects for permanent, definitive surgery.  DESCRIPTION OF PROCEDURE:  After adequate general endotracheal anesthesia, the patient was placed in a dorsal supine position with the legs in the candy cane stirrups.  The patient received 2 g IV cefoxitin prior to commencement of the case.  The patient's abdomen, perineum, and vagina were prepped and draped in the normal sterile fashion.  A timeout was performed.  The patient's bladder was catheterized with a red Robinson catheter, yielding 200 cc of clear urine.  The patient was placed in Trendelenburg, and a weighted speculum was placed in the posterior vaginal vault.  The cervix was circumferentially injected with 0.5% lidocaine with 1:100,000 epinephrine.  A direct posterior colpotomy incision was made.  Upon entering into the posterior cul-de-sac, the uterosacral ligaments were bilaterally clamped, transected, suture ligated with 0 Vicryl suture and tagged for identification later.  A circumferential incision was made with the Bovie, and the anterior cul- de-sac was entered without difficulty.  The  cardinal ligaments were bilaterally clamped, transected, suture ligated with 0 Vicryl suture. The uterine arteries were bilaterally clamped, transected, and suture ligated with 0 Vicryl suture.  Sequential bites in the broad ligament were performed, and the cornua were ultimately bilaterally clamped, transected, and suture ligated with 0 Vicryl suture.  Each fallopian tube was identified and clamped and removed, and the pedicle was closed with 0 Vicryl suture.  The ovaries appeared normal.  Good hemostasis appeared normal.  The peritoneum was then closed with a pursestring suture of 2-0 PDS.  The vaginal cuff was then closed with a running 0 Vicryl suture.  The uterosacral ligaments were plicated centrally, and the rest of the vaginal vault was closed.  Good hemostasis was noted.  There were no complications. Estimated blood loss was 50 cc.  Intraoperative fluids were 800 cc. Urine output was 200 cc.  The patient tolerated the procedure well and was taken to the recovery room in good condition.          ______________________________ Jennell Cornerhomas Schermerhorn, MD     TS/MEDQ  D:  04/13/2015  T:  04/13/2015  Job:  [id number]

## 2015-04-18 LAB — SURGICAL PATHOLOGY

## 2015-04-27 ENCOUNTER — Ambulatory Visit
Admission: RE | Admit: 2015-04-27 | Discharge: 2015-04-27 | Disposition: A | Discharge status: Home / Self Care | Payer: BC Managed Care – PPO | Source: Ambulatory Visit | Attending: Obstetrics and Gynecology | Admitting: Obstetrics and Gynecology

## 2015-04-27 DIAGNOSIS — Z1231 Encounter for screening mammogram for malignant neoplasm of breast: Secondary | ICD-10-CM | POA: Insufficient documentation

## 2016-01-22 ENCOUNTER — Other Ambulatory Visit: Payer: Self-pay | Admitting: Obstetrics and Gynecology

## 2016-01-22 DIAGNOSIS — Z1231 Encounter for screening mammogram for malignant neoplasm of breast: Secondary | ICD-10-CM

## 2016-04-28 ENCOUNTER — Ambulatory Visit: Payer: BC Managed Care – PPO | Attending: Obstetrics and Gynecology

## 2016-05-24 IMAGING — MG MM DIGITAL SCREENING BILAT W/ CAD
1 series · 4 of 4 positions shown · non-contrast
Comparison: Previous exam(s).

CLINICAL DATA: Screening.

EXAM:
DIGITAL SCREENING BILATERAL MAMMOGRAM WITH CAD

[L CC · left · 4 of 4 slices shown]
[im 1/4]
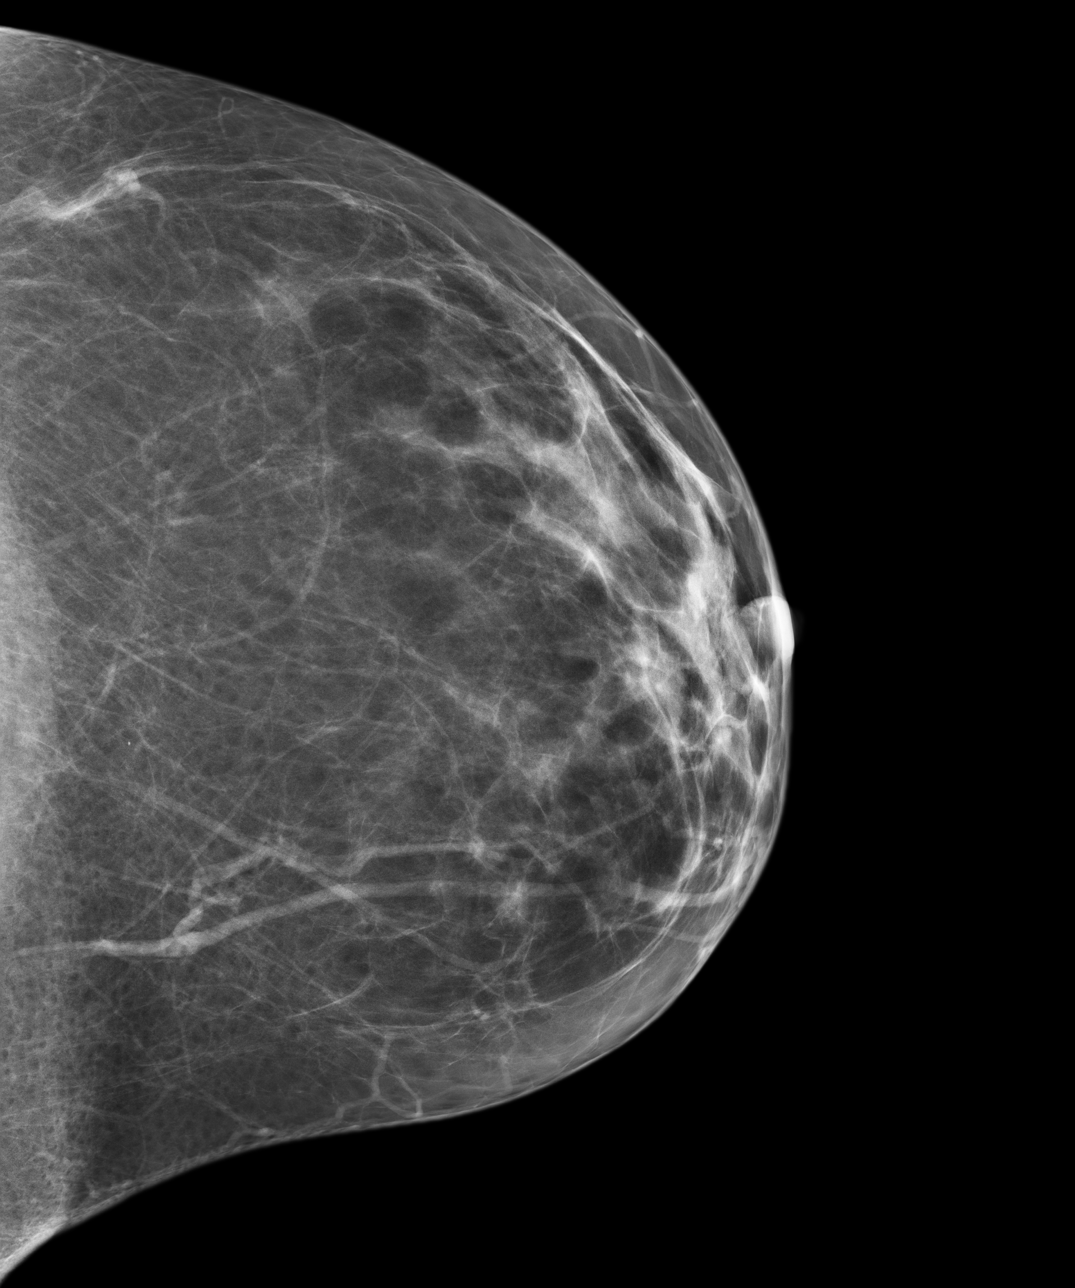
[im 2/4]
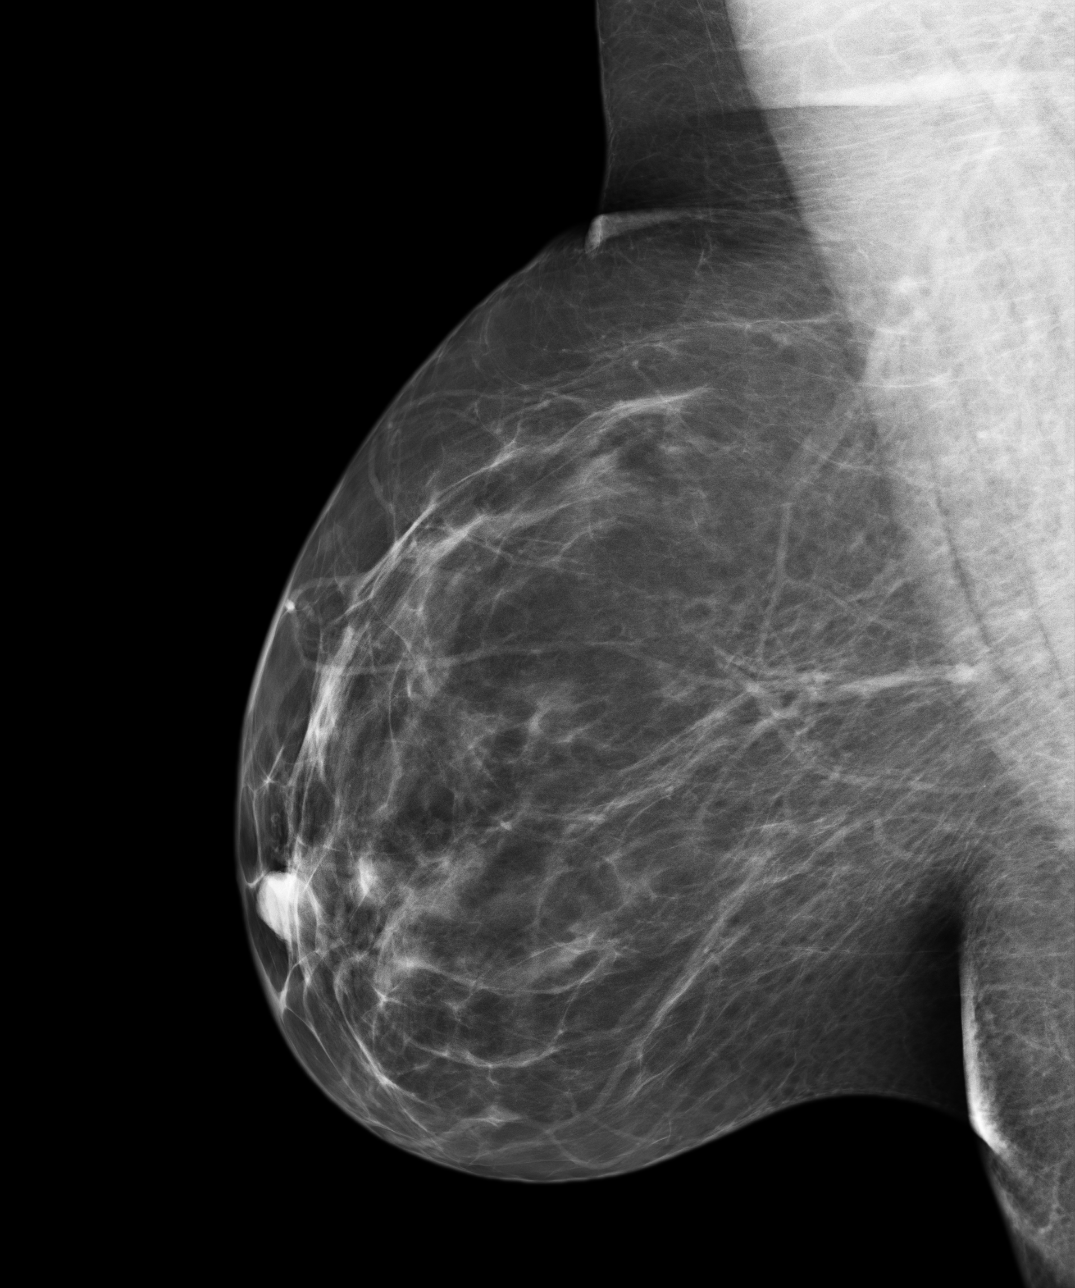
[im 3/4]
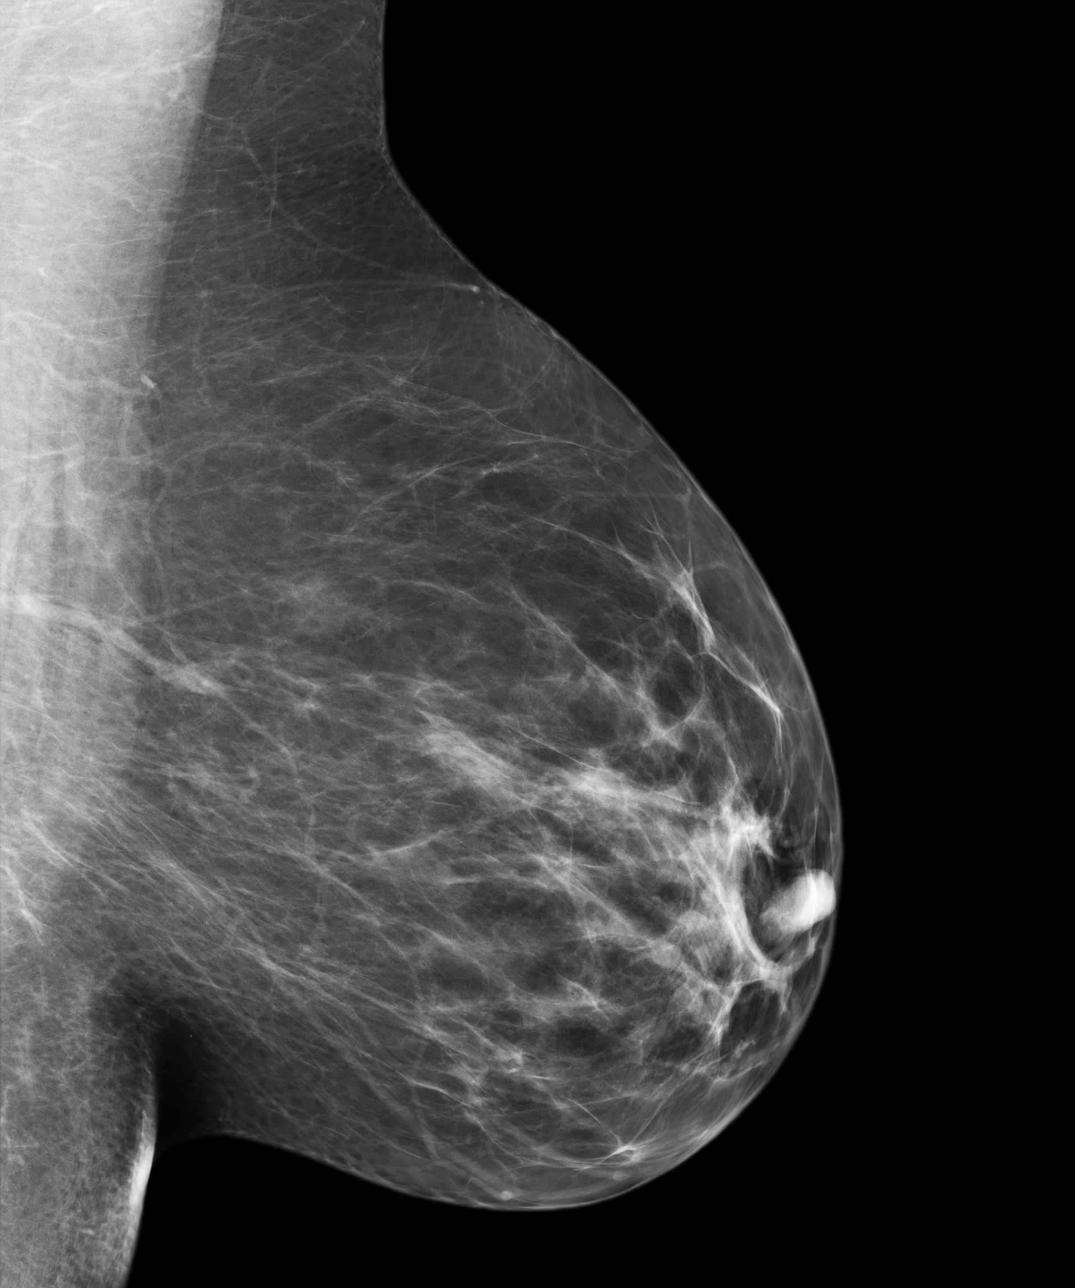
[im 4/4]
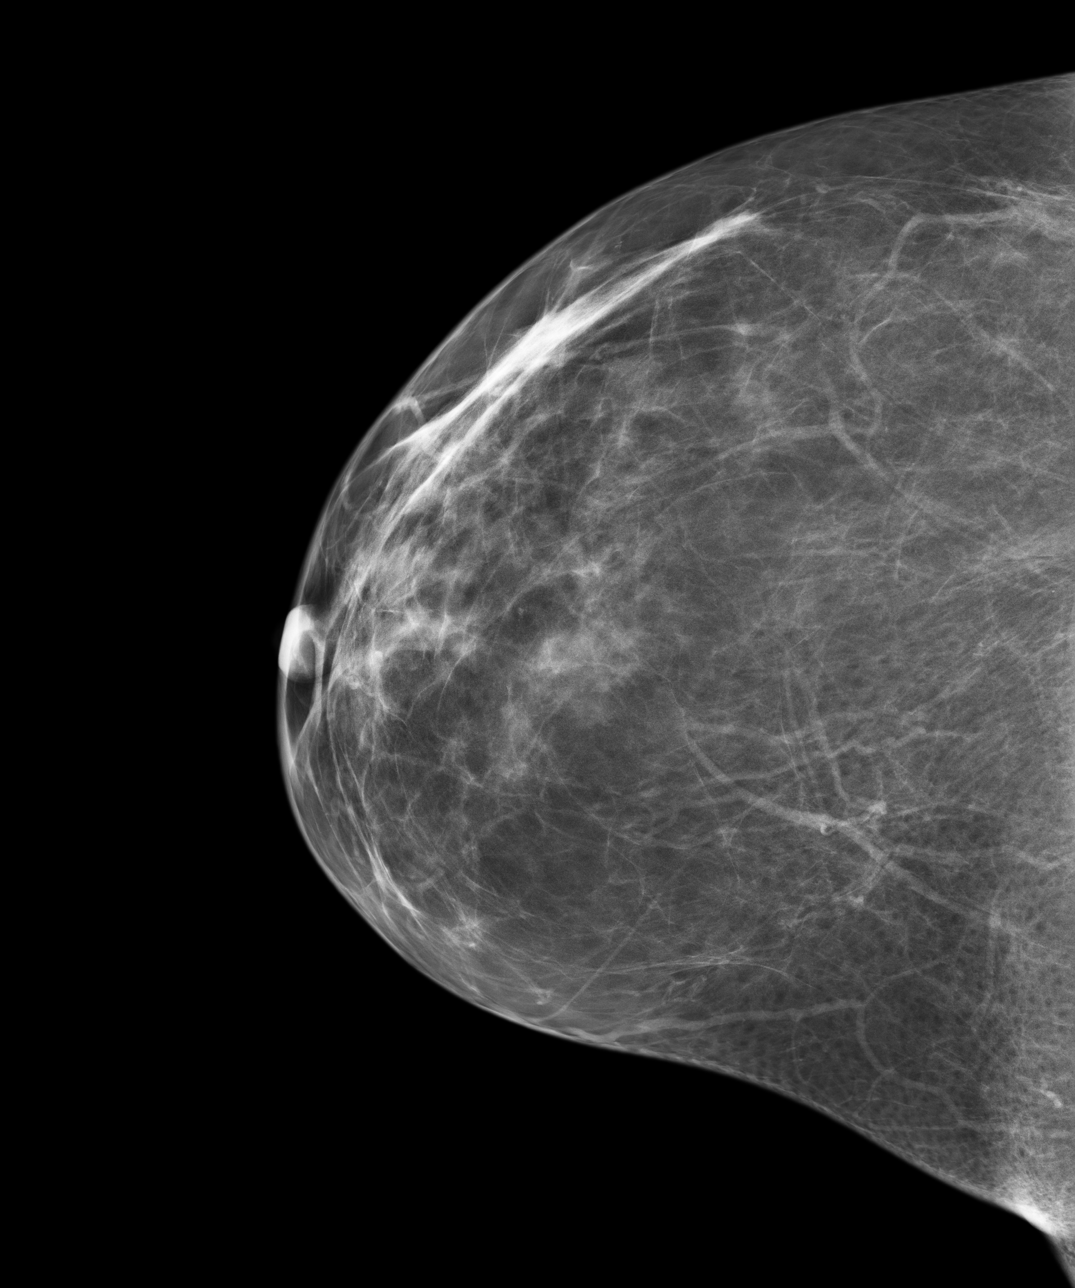

[4 of 4 positions shown; findings below may reference images not displayed]

ACR Breast Density Category b: There are scattered areas of
fibroglandular density.
FINDINGS: There are no findings suspicious for malignancy. Images were
processed with CAD.
IMPRESSION: No mammographic evidence of malignancy. A result letter of this
screening mammogram will be mailed directly to the patient.

RECOMMENDATION:
Screening mammogram in one year. (Code:AS-G-LCT)

BI-RADS CATEGORY  1: Negative.

## 2016-06-02 ENCOUNTER — Ambulatory Visit: Payer: BC Managed Care – PPO

## 2016-06-11 ENCOUNTER — Ambulatory Visit
Admission: RE | Admit: 2016-06-11 | Discharge: 2016-06-11 | Disposition: A | Discharge status: Home / Self Care | Payer: BC Managed Care – PPO | Source: Ambulatory Visit | Attending: Obstetrics and Gynecology | Admitting: Obstetrics and Gynecology

## 2016-06-11 ENCOUNTER — Other Ambulatory Visit: Payer: Self-pay | Admitting: Obstetrics and Gynecology

## 2016-06-11 DIAGNOSIS — Z1231 Encounter for screening mammogram for malignant neoplasm of breast: Secondary | ICD-10-CM | POA: Insufficient documentation

## 2017-05-30 ENCOUNTER — Inpatient Hospital Stay: Payer: BC Managed Care – PPO

## 2017-05-30 ENCOUNTER — Emergency Department: Payer: BC Managed Care – PPO

## 2017-05-30 ENCOUNTER — Inpatient Hospital Stay
Admission: EM | Admit: 2017-05-30 | Discharge: 2017-06-01 | DRG: 065 | Disposition: A | Discharge status: Home / Self Care | Payer: BC Managed Care – PPO | Attending: Internal Medicine | Admitting: Internal Medicine

## 2017-05-30 ENCOUNTER — Encounter: Payer: Self-pay | Admitting: Emergency Medicine

## 2017-05-30 DIAGNOSIS — R200 Anesthesia of skin: Secondary | ICD-10-CM

## 2017-05-30 DIAGNOSIS — Q211 Atrial septal defect: Secondary | ICD-10-CM

## 2017-05-30 DIAGNOSIS — R29701 NIHSS score 1: Secondary | ICD-10-CM | POA: Diagnosis present

## 2017-05-30 DIAGNOSIS — R410 Disorientation, unspecified: Secondary | ICD-10-CM

## 2017-05-30 DIAGNOSIS — R030 Elevated blood-pressure reading, without diagnosis of hypertension: Secondary | ICD-10-CM | POA: Diagnosis present

## 2017-05-30 DIAGNOSIS — I959 Hypotension, unspecified: Secondary | ICD-10-CM | POA: Diagnosis present

## 2017-05-30 DIAGNOSIS — Z90710 Acquired absence of both cervix and uterus: Secondary | ICD-10-CM

## 2017-05-30 DIAGNOSIS — I253 Aneurysm of heart: Secondary | ICD-10-CM | POA: Diagnosis present

## 2017-05-30 DIAGNOSIS — Z79891 Long term (current) use of opiate analgesic: Secondary | ICD-10-CM

## 2017-05-30 DIAGNOSIS — I639 Cerebral infarction, unspecified: Secondary | ICD-10-CM | POA: Diagnosis present

## 2017-05-30 DIAGNOSIS — I638 Other cerebral infarction: Secondary | ICD-10-CM | POA: Diagnosis not present

## 2017-05-30 DIAGNOSIS — Z79899 Other long term (current) drug therapy: Secondary | ICD-10-CM | POA: Diagnosis not present

## 2017-05-30 DIAGNOSIS — E875 Hyperkalemia: Secondary | ICD-10-CM | POA: Diagnosis present

## 2017-05-30 LAB — BASIC METABOLIC PANEL
Anion gap: 6 (ref 5–15)
BUN: 15 mg/dL (ref 6–20)
CALCIUM: 9 mg/dL (ref 8.9–10.3)
CO2: 24 mmol/L (ref 22–32)
Chloride: 107 mmol/L (ref 101–111)
Creatinine, Ser: 0.83 mg/dL (ref 0.44–1.00)
GFR calc Af Amer: >60 mL/min (ref >60)
Glucose, Bld: 99 mg/dL (ref 65–99)
Potassium: 3.3 mmol/L — ABNORMAL LOW (ref 3.5–5.1)
Sodium: 137 mmol/L (ref 135–145)

## 2017-05-30 LAB — CBC
HCT: 36.7 % (ref 35.0–47.0)
Hemoglobin: 12.6 g/dL (ref 12.0–16.0)
MCH: 31.8 pg (ref 26.0–34.0)
MCHC: 34.4 g/dL (ref 32.0–36.0)
MCV: 92.3 fL (ref 80.0–100.0)
PLATELETS: 206 10*3/uL (ref 150–440)
RBC: 3.97 MIL/uL (ref 3.80–5.20)
RDW: 12.9 % (ref 11.5–14.5)
WBC: 4.6 10*3/uL (ref 3.6–11.0)

## 2017-05-30 MED ORDER — POTASSIUM CITRATE ER 10 MEQ (1080 MG) PO TBCR
20.0000 meq | EXTENDED_RELEASE_TABLET | Freq: Once | ORAL | Status: AC
  Administered 2017-05-30: 20 meq via ORAL
  Filled 2017-05-30: qty 2

## 2017-05-30 MED ORDER — ATORVASTATIN CALCIUM 20 MG PO TABS
10.0000 mg | ORAL_TABLET | Freq: Every day | ORAL | Status: DC
  Administered 2017-05-30: 10 mg via ORAL
  Filled 2017-05-30: qty 1

## 2017-05-30 MED ORDER — ACETAMINOPHEN 650 MG RE SUPP: 650.0000 mg | RECTAL | Status: DC | PRN

## 2017-05-30 MED ORDER — GABAPENTIN 300 MG PO CAPS
300.0000 mg | ORAL_CAPSULE | Freq: Three times a day (TID) | ORAL | Status: DC
  Administered 2017-05-30 – 2017-06-01 (×6): 300 mg via ORAL
  Filled 2017-05-30 (×6): qty 1

## 2017-05-30 MED ORDER — ASPIRIN 325 MG PO TABS
325.0000 mg | ORAL_TABLET | Freq: Every day | ORAL | Status: DC
  Administered 2017-05-31 – 2017-06-01 (×2): 325 mg via ORAL
  Filled 2017-05-30 (×3): qty 1

## 2017-05-30 MED ORDER — ENOXAPARIN SODIUM 40 MG/0.4ML ~~LOC~~ SOLN
40.0000 mg | SUBCUTANEOUS | Status: DC
  Administered 2017-05-30 – 2017-05-31 (×2): 40 mg via SUBCUTANEOUS
  Filled 2017-05-30 (×2): qty 0.4

## 2017-05-30 MED ORDER — ACETAMINOPHEN 325 MG PO TABS
650.0000 mg | ORAL_TABLET | ORAL | Status: DC | PRN
  Administered 2017-05-30 – 2017-05-31 (×3): 650 mg via ORAL
  Filled 2017-05-30 (×4): qty 2

## 2017-05-30 MED ORDER — ASPIRIN 81 MG PO CHEW
324.0000 mg | CHEWABLE_TABLET | Freq: Once | ORAL | Status: AC
  Administered 2017-05-30: 324 mg via ORAL
  Filled 2017-05-30: qty 4

## 2017-05-30 MED ORDER — STROKE: EARLY STAGES OF RECOVERY BOOK
Freq: Once | Status: AC
  Administered 2017-05-30: 14:00:00

## 2017-05-30 MED ORDER — ACETAMINOPHEN 160 MG/5ML PO SOLN
650.0000 mg | ORAL | Status: DC | PRN
  Filled 2017-05-30: qty 20.3

## 2017-05-30 NOTE — H&P (Signed)
Kristina [name] is an 46 y.o. female.   Chief Complaint: Left arm burning HPI: This is a 46 year old female who awoke in the middle of night with burning in her left arm and numbness in her left side of her face. Since then the symptoms been waxing and waning. She also complained about some numbness in the back of her mouth. No symptoms in the lower extremities. No weakness. MRI showed an acute thalamic stroke. She is being admitted for further workup.  History reviewed. No pertinent past medical history.  Past Surgical History:  Procedure Laterality Date  . ABDOMINAL HYSTERECTOMY     04/13/15  . CHOLECYSTECTOMY    . FRACTURE SURGERY Right    ankle fusion  . LEEP    . SALPINGOOPHORECTOMY Bilateral 04/13/2015   Procedure: SALPINGO OOPHORECTOMY;  Surgeon: Boykin Nearing, MD;  Location: [facility] ORS;  Service: Gynecology;  Laterality: Bilateral;  . TONSILLECTOMY    . VAGINAL HYSTERECTOMY N/A 04/13/2015   Procedure: HYSTERECTOMY VAGINAL;  Surgeon: Boykin Nearing, MD;  Location: [facility] ORS;  Service: Gynecology;  Laterality: N/A;    Family History  Problem Relation Age of Onset  . Breast cancer Maternal Uncle 30   Social History:  reports that she has never smoked. She has never used smokeless tobacco. She reports that she does not drink alcohol or use drugs.  Allergies: No Known Allergies   (Not in a hospital admission)  Results for orders placed or performed during the hospital encounter of 05/30/17 (from the past 48 hour(s))  CBC     Status: None   Collection Time: 05/30/17  8:21 AM  Result Value Ref Range   WBC 4.6 3.6 - 11.0 K/uL   RBC 3.97 3.80 - 5.20 MIL/uL   Hemoglobin 12.6 12.0 - 16.0 g/dL   HCT 36.7 35.0 - 47.0 %   MCV 92.3 80.0 - 100.0 fL   MCH 31.8 26.0 - 34.0 pg   MCHC 34.4 32.0 - 36.0 g/dL   RDW 12.9 11.5 - 14.5 %   Platelets 206 150 - 440 K/uL  Basic metabolic panel     Status: Abnormal   Collection Time: 05/30/17  8:21 AM  Result Value Ref Range   Sodium  137 135 - 145 mmol/L   Potassium 3.3 (L) 3.5 - 5.1 mmol/L   Chloride 107 101 - 111 mmol/L   CO2 24 22 - 32 mmol/L   Glucose, Bld 99 65 - 99 mg/dL   BUN 15 6 - 20 mg/dL   Creatinine, Ser 0.83 0.44 - 1.00 mg/dL   Calcium 9.0 8.9 - 10.3 mg/dL   GFR calc non Af Amer >60 >60 mL/min   GFR calc Af Amer >60 >60 mL/min    Comment: (NOTE) The eGFR has been calculated using the CKD EPI equation. This calculation has not been validated in all clinical situations. eGFR's persistently <60 mL/min signify possible Chronic Kidney Disease.    Anion gap 6 5 - 15   Mr Brain Wo Contrast  Result Date: 05/30/2017 CLINICAL DATA:  Numbness in LEFT arm and face, last seen normal earlier today. No motor involvement. Concern for cerebral infarction. EXAM: MRI HEAD WITHOUT CONTRAST MRI CERVICAL SPINE WITHOUT CONTRAST TECHNIQUE: Multiplanar, multiecho pulse sequences of the brain and surrounding structures, and cervical spine, to include the craniocervical junction and cervicothoracic junction, were obtained without intravenous contrast. COMPARISON:  None. FINDINGS: MRI HEAD FINDINGS Brain: There is a small focus of restricted diffusion, RIGHT lateral thalamus, seen on both axial  and coronal diffusion weighted images, corresponding low ADC, consistent with acute infarction. No hemorrhage, mass lesion, hydrocephalus, or extra-axial collection. Normal for age cerebral volume. Small foci of subcortical white matter signal abnormality, nonspecific, could represent early chronic microvascular ischemic change. Vascular: Normal flow voids. Skull and upper cervical spine: Normal marrow signal. Sinuses/Orbits: Negative. Other: None. MRI CERVICAL SPINE FINDINGS Alignment: Physiologic. Vertebrae: No fracture, evidence of discitis, or bone lesion. Cord: Normal signal and morphology. Posterior Fossa, vertebral arteries, paraspinal tissues: Negative. Disc levels: Annular bulge at C4-5. Hypoplastic C5-C6 disc space.No disc protrusion or  spinal stenosis. No neural impingement. IMPRESSION: Small focus of restricted diffusion RIGHT lateral thalamus consistent with acute nonhemorrhagic infarction. This correlates with the reported sensory symptoms. Suspected early chronic microvascular ischemic change in the white matter. Unremarkable cervical spine imaging. No LEFT-sided neural impingement is evident. Electronically Signed   By: Staci Righter M.D.   On: 05/30/2017 10:45   Mr Cervical Spine Wo Contrast  Result Date: 05/30/2017 CLINICAL DATA:  Numbness in LEFT arm and face, last seen normal earlier today. No motor involvement. Concern for cerebral infarction. EXAM: MRI HEAD WITHOUT CONTRAST MRI CERVICAL SPINE WITHOUT CONTRAST TECHNIQUE: Multiplanar, multiecho pulse sequences of the brain and surrounding structures, and cervical spine, to include the craniocervical junction and cervicothoracic junction, were obtained without intravenous contrast. COMPARISON:  None. FINDINGS: MRI HEAD FINDINGS Brain: There is a small focus of restricted diffusion, RIGHT lateral thalamus, seen on both axial and coronal diffusion weighted images, corresponding low ADC, consistent with acute infarction. No hemorrhage, mass lesion, hydrocephalus, or extra-axial collection. Normal for age cerebral volume. Small foci of subcortical white matter signal abnormality, nonspecific, could represent early chronic microvascular ischemic change. Vascular: Normal flow voids. Skull and upper cervical spine: Normal marrow signal. Sinuses/Orbits: Negative. Other: None. MRI CERVICAL SPINE FINDINGS Alignment: Physiologic. Vertebrae: No fracture, evidence of discitis, or bone lesion. Cord: Normal signal and morphology. Posterior Fossa, vertebral arteries, paraspinal tissues: Negative. Disc levels: Annular bulge at C4-5. Hypoplastic C5-C6 disc space.No disc protrusion or spinal stenosis. No neural impingement. IMPRESSION: Small focus of restricted diffusion RIGHT lateral thalamus consistent  with acute nonhemorrhagic infarction. This correlates with the reported sensory symptoms. Suspected early chronic microvascular ischemic change in the white matter. Unremarkable cervical spine imaging. No LEFT-sided neural impingement is evident. Electronically Signed   By: Staci Righter M.D.   On: 05/30/2017 10:45    Review of Systems  Constitutional: Negative for chills and fever.  HENT: Negative for hearing loss.   Eyes: Negative for blurred vision.  Respiratory: Negative for cough.   Cardiovascular: Negative for chest pain.  Gastrointestinal: Negative for heartburn.  Genitourinary: Negative for dysuria.  Musculoskeletal: Negative for myalgias.  Skin: Negative for rash.  Neurological: Negative for dizziness.    Blood pressure 116/77, pulse 72, temperature 98.4 F (36.9 C), temperature source Oral, resp. rate 16, weight 88.5 kg (195 lb), last menstrual period 03/28/2015, SpO2 100 %. Physical Exam  Constitutional: She is oriented to person, place, and time. She appears well-developed and well-nourished. No distress.  HENT:  Head: Normocephalic and atraumatic.  Mouth/Throat: Oropharynx is clear and moist. No oropharyngeal exudate.  Eyes: Pupils are equal, round, and reactive to light. No scleral icterus.  Neck: Neck supple. No JVD present. No tracheal deviation present. No thyromegaly present.  Cardiovascular: Normal rate.   No murmur heard. Respiratory: Breath sounds normal. No respiratory distress. She has no wheezes. She has no rales. She exhibits no tenderness.  GI: Soft. Bowel sounds are normal.  She exhibits no distension and no mass. There is no tenderness. There is no rebound and no guarding.  Musculoskeletal: Normal range of motion. She exhibits no edema or tenderness.  Lymphadenopathy:    She has no cervical adenopathy.  Neurological: She is alert and oriented to person, place, and time. No cranial nerve deficit. Coordination normal.  Skin: Skin is warm and dry.      Assessment/Plan 1. Acute CVA. MRI shows no acute right thalamic stroke. This would correlate with her symptoms. She has no risk factors. Family history is negative. She does not smoke. No history of hypertension. We'll go ahead and start her on aspirin and statin. Continue workup with CTA carotid Dopplers and echocardiogram with bubble study. If these prove to be negative and will likely need hypercoagulability workup.  2. Hyperkalemia. Will replete. 3. Elevated blood pressure. Only one reading above normal in the ER. No history of hypertension. However in the setting of an acute event we will allow permissive hypertension.  Total time spent 45 minutes  Baxter Hire, MD 05/30/2017, 11:58 AM

## 2017-05-30 NOTE — ED Notes (Signed)
Pt being transported to MRI at this time.

## 2017-05-30 NOTE — ED Triage Notes (Signed)
Pt states that she woke up this morning at 0630 with numbness in her left arm and numbness in the left side of her face. Last known normal at 0130 when she went to bed. No facial droop noted, grips equal bilaterally. Pt states that she has sensation that comes and goes that feel like her arm is on fire.

## 2017-05-30 NOTE — ED Notes (Signed)
Pt in MRI.

## 2017-05-30 NOTE — Consult Note (Signed)
Referring Physician: Dr. Letitia Libra    Chief Complaint: Stroke  HPI: Kristina [name] is an 46 y.o. female who woke up with left arm and face tingling and burning. She has no significant PMHx. 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 Symptoms wax and wane.  Date last known well: 7/14 130am tPA Given: no  History reviewed. No pertinent past medical history.  Past Surgical History:  Procedure Laterality Date  . ABDOMINAL HYSTERECTOMY     04/13/15  . CHOLECYSTECTOMY    . FRACTURE SURGERY Right    ankle fusion  . LEEP    . SALPINGOOPHORECTOMY Bilateral 04/13/2015   Procedure: SALPINGO OOPHORECTOMY;  Surgeon: Suzy Bouchard, MD;  Location: [facility] ORS;  Service: Gynecology;  Laterality: Bilateral;  . TONSILLECTOMY    . VAGINAL HYSTERECTOMY N/A 04/13/2015   Procedure: HYSTERECTOMY VAGINAL;  Surgeon: Suzy Bouchard, MD;  Location: [facility];  Service: Gynecology;  Laterality: N/A;    Family History  Problem Relation Age of Onset  . Breast cancer Maternal Uncle 30   Social History:  reports that she has never smoked. She has never used smokeless tobacco. She reports that she does not drink alcohol or use drugs.  Allergies: No Known Allergies  Medications:  Prior to Admission medications   Medication Sig Start Date End Date Taking? Authorizing Provider  oxyCODONE-acetaminophen (PERCOCET/ROXICET) 5-325 MG per tablet Take 2 tablets by mouth every 4 (four) hours as needed for moderate pain. 04/14/15  Yes Schermerhorn, Ihor Austin, MD  docusate sodium (COLACE) 50 MG capsule Take 1 capsule (50 mg total) by mouth 2 (two) times daily. Patient not taking: Reported on 05/30/2017 04/14/15   Schermerhorn, Ihor Austin, MD  naproxen (NAPROSYN) 500 MG tablet Take 1 tablet (500 mg total) by mouth 2 (two) times daily with a meal. Patient not taking: Reported on 05/30/2017 04/14/15   Schermerhorn, Ihor Austin, MD     ROS: No CP, no SOB all others negative  Physical Examination: Blood  pressure 116/69, pulse 81, temperature 98.3 F (36.8 C), temperature source Oral, resp. rate 19, weight 195 lb (88.5 kg), last menstrual period 03/28/2015, SpO2 100 %.  Physical exam: Exam: Gen: NAD, conversant, well nourised, obese, well groomed                     CV: RRR, no MRG. No Carotid Bruits. No peripheral edema, warm, nontender Eyes: Conjunctivae clear without exudates or hemorrhage  Neuro: Detailed Neurologic Exam  Speech:    Speech is normal; fluent and spontaneous with normal comprehension.  Cognition:    The patient is oriented to person, place, and time;     recent and remote memory intact;     language fluent;     normal attention, concentration,     fund of knowledge Cranial Nerves:    The pupils are equal, round, and reactive to light. The fundi are normal and spontaneous venous pulsations are present. Visual fields are full to finger confrontation. Extraocular movements are intact. Left face hyperesthesias. The face is symmetric. The palate elevates in the midline. Hearing intact. Voice is normal. Shoulder shrug is normal. The tongue has normal motion without fasciculations.   Coordination:    Normal finger to nose and heel to shin. Normal rapid alternating movements.   Motor Observation:    No asymmetry, no atrophy, and no involuntary movements noted. Tone:    Normal muscle tone.    Posture:    Posture is normal. normal erect  Strength:    Strength is V/V in the upper and lower limbs.      Sensation: left arm hyperesthesias     Reflex Exam:  DTR's:    Deep tendon reflexes in the upper and lower extremities are normal bilaterally.   Toes:    The toes are downgoing bilaterally.   Clonus:    Clonus is absent.   Laboratory Studies:  Basic Metabolic Panel:  Recent Labs Lab 05/30/17 0821  NA 137  K 3.3*  CL 107  CO2 24  GLUCOSE 99  BUN 15  CREATININE 0.83  CALCIUM 9.0    Liver Function Tests: No results for input(s): AST, ALT, ALKPHOS,  BILITOT, PROT, ALBUMIN in the last 168 hours. No results for input(s): LIPASE, AMYLASE in the last 168 hours. No results for input(s): AMMONIA in the last 168 hours.  CBC:  Recent Labs Lab 05/30/17 0821  WBC 4.6  HGB 12.6  HCT 36.7  MCV 92.3  PLT 206    Cardiac Enzymes: No results for input(s): CKTOTAL, CKMB, CKMBINDEX, TROPONINI in the last 168 hours.  BNP: Invalid input(s): POCBNP  CBG: No results for input(s): GLUCAP in the last 168 hours.  Microbiology: No results found for this or any previous visit.  Coagulation Studies: No results for input(s): LABPROT, INR in the last 72 hours.  Urinalysis: No results for input(s): COLORURINE, LABSPEC, PHURINE, GLUCOSEU, HGBUR, BILIRUBINUR, KETONESUR, PROTEINUR, UROBILINOGEN, NITRITE, LEUKOCYTESUR in the last 168 hours.  Invalid input(s): APPERANCEUR  Lipid Panel: No results found for: CHOL, TRIG, HDL, CHOLHDL, VLDL, LDLCALC  HgbA1C: No results found for: HGBA1C  Urine Drug Screen:  No results found for: LABOPIA, COCAINSCRNUR, LABBENZ, AMPHETMU, THCU, LABBARB  Alcohol Level: No results for input(s): ETH in the last 168 hours.  Imaging: Mr Brain Wo Contrast  Result Date: 05/30/2017 CLINICAL DATA:  Numbness in LEFT arm and face, last seen normal earlier today. No motor involvement. Concern for cerebral infarction. EXAM: MRI HEAD WITHOUT CONTRAST MRI CERVICAL SPINE WITHOUT CONTRAST TECHNIQUE: Multiplanar, multiecho pulse sequences of the brain and surrounding structures, and cervical spine, to include the craniocervical junction and cervicothoracic junction, were obtained without intravenous contrast. COMPARISON:  None. FINDINGS: MRI HEAD FINDINGS Brain: There is a small focus of restricted diffusion, RIGHT lateral thalamus, seen on both axial and coronal diffusion weighted images, corresponding low ADC, consistent with acute infarction. No hemorrhage, mass lesion, hydrocephalus, or extra-axial collection. Normal for age cerebral  volume. Small foci of subcortical white matter signal abnormality, nonspecific, could represent early chronic microvascular ischemic change. Vascular: Normal flow voids. Skull and upper cervical spine: Normal marrow signal. Sinuses/Orbits: Negative. Other: None. MRI CERVICAL SPINE FINDINGS Alignment: Physiologic. Vertebrae: No fracture, evidence of discitis, or bone lesion. Cord: Normal signal and morphology. Posterior Fossa, vertebral arteries, paraspinal tissues: Negative. Disc levels: Annular bulge at C4-5. Hypoplastic C5-C6 disc space.No disc protrusion or spinal stenosis. No neural impingement. IMPRESSION: Small focus of restricted diffusion RIGHT lateral thalamus consistent with acute nonhemorrhagic infarction. This correlates with the reported sensory symptoms. Suspected early chronic microvascular ischemic change in the white matter. Unremarkable cervical spine imaging. No LEFT-sided neural impingement is evident. Electronically Signed   By: Elsie Stain M.D.   On: 05/30/2017 10:45   Mr Cervical Spine Wo Contrast  Result Date: 05/30/2017 CLINICAL DATA:  Numbness in LEFT arm and face, last seen normal earlier today. No motor involvement. Concern for cerebral infarction. EXAM: MRI HEAD WITHOUT CONTRAST MRI CERVICAL SPINE WITHOUT CONTRAST TECHNIQUE: Multiplanar, multiecho pulse sequences of  the brain and surrounding structures, and cervical spine, to include the craniocervical junction and cervicothoracic junction, were obtained without intravenous contrast. COMPARISON:  None. FINDINGS: MRI HEAD FINDINGS Brain: There is a small focus of restricted diffusion, RIGHT lateral thalamus, seen on both axial and coronal diffusion weighted images, corresponding low ADC, consistent with acute infarction. No hemorrhage, mass lesion, hydrocephalus, or extra-axial collection. Normal for age cerebral volume. Small foci of subcortical white matter signal abnormality, nonspecific, could represent early chronic  microvascular ischemic change. Vascular: Normal flow voids. Skull and upper cervical spine: Normal marrow signal. Sinuses/Orbits: Negative. Other: None. MRI CERVICAL SPINE FINDINGS Alignment: Physiologic. Vertebrae: No fracture, evidence of discitis, or bone lesion. Cord: Normal signal and morphology. Posterior Fossa, vertebral arteries, paraspinal tissues: Negative. Disc levels: Annular bulge at C4-5. Hypoplastic C5-C6 disc space.No disc protrusion or spinal stenosis. No neural impingement. IMPRESSION: Small focus of restricted diffusion RIGHT lateral thalamus consistent with acute nonhemorrhagic infarction. This correlates with the reported sensory symptoms. Suspected early chronic microvascular ischemic change in the white matter. Unremarkable cervical spine imaging. No LEFT-sided neural impingement is evident. Electronically Signed   By: Elsie StainJohn T Curnes M.D.   On: 05/30/2017 10:45   Mr Maxine GlennMra Head/brain WJWo Cm  Result Date: 05/30/2017 CLINICAL DATA:  Acute RIGHT thalamus infarct. EXAM: MRA HEAD WITHOUT CONTRAST TECHNIQUE: Angiographic images of the Circle of Willis were obtained using MRA technique without intravenous contrast. COMPARISON:  MRI brain earlier today. FINDINGS: The internal carotid arteries are widely patent. The basilar artery is widely patent with vertebrals codominant. There is no visible intracranial stenosis or saccular aneurysm. IMPRESSION: Negative MRA intracranial circulation. Electronically Signed   By: Elsie StainJohn T Curnes M.D.   On: 05/30/2017 15:30    Assessment: 46 y.o. female with no significant PMHx presenting with right thalamic stroke  Plan: 1. HgbA1c, fasting lipid panel 2. MRI, MRA  of the brain without contrast 3. PT consult, OT consult, Speech consult 4. Echocardiogram 5. Carotid dopplers 6. Prophylactic therapy: ASA 325mg  7. NPO until RN stroke swallow screen 8. Telemetry monitoring 9. Frequent neuro checks  Kristina [name] Neurology 05/30/2017, 9:05 PM

## 2017-05-30 NOTE — Evaluation (Signed)
Physical Therapy Evaluation Patient Details Name: JEANMARIE MCCOWEN MRN: [id number] DOB: March 13, 1971 Today's Date: 05/30/2017   History of Present Illness  This is a 46 year old female who awoke in the middle of night with burning in her left arm and numbness in her left side of her face. Since then the symptoms been waxing and waning. She also complained about some numbness in the back of her mouth. No symptoms in the lower extremities. No weakness. MRI showed an acute thalamic stroke. She is being admitted for further workup.  Clinical Impression  Pt presents to PT at baseline functional mobility with no strength or balance deficits.  Pt educated on stroke recovery and signs/symptoms of stroke.  No further acute PT needs at this time.    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  Stairs            Wheelchair Mobility    Modified Rankin (Stroke Patients Only)       Balance Overall balance assessment: Independent                                           Pertinent Vitals/Pain Pain Assessment:  (slight headache)    Home Living Family/patient expects to be discharged to:: Private residence Living Arrangements: Spouse/significant other;Children Available Help at Discharge: Family Type of Home: House Home Access: Level entry     Home Layout: Two level;Able to live on main level  with bedroom/bathroom Home Equipment: Dan Humphreys - 2 wheels;Cane - single point;Cane - quad;Crutches      Prior Function Level of Independence: Independent         Comments: 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, works     Higher education careers adviser        Extremity/Trunk Assessment   Upper Extremity Assessment Upper Extremity Assessment: Overall WFL for tasks assessed    Lower Extremity Assessment Lower Extremity Assessment: Overall WFL for tasks assessed    Cervical / Trunk Assessment Cervical / Trunk Assessment: Normal  Communication   Communication: No difficulties  Cognition Arousal/Alertness: Awake/alert Behavior During Therapy: WFL for tasks assessed/performed Overall Cognitive Status: Within Functional Limits for tasks assessed                                 General Comments: plesant and following all commands      General Comments      Exercises     Assessment/Plan    PT Assessment Patent does not need any further PT services  PT Problem List         PT Treatment Interventions      PT Goals (Current goals can be found in the  Care Plan section)  Acute Rehab PT Goals Patient Stated Goal: To go home. PT Goal Formulation: With patient    Frequency     Barriers to discharge        Co-evaluation               AM-PAC PT "6 Clicks" Daily Activity  Outcome Measure Difficulty turning over in bed (including adjusting bedclothes, sheets and blankets)?: None Difficulty moving from lying on back to sitting on the side of the bed? : None Difficulty sitting down on and standing up from a chair with arms (e.g., wheelchair, bedside commode, etc,.)?: None Help needed moving to and from a bed to chair (including a wheelchair)?: None Help needed walking in hospital room?: None Help needed climbing 3-5 steps with a railing? : None 6 Click Score: 24    End of Session   Activity Tolerance: Patient tolerated treatment well Patient left: in bed;with family/visitor present    PT Visit Diagnosis: Other abnormalities of gait and mobility (R26.89)    Time: 1555-1610 PT Time Calculation (min) (ACUTE ONLY): 15 min   Charges:   PT Evaluation $PT Eval Low Complexity: 1 Procedure     PT G Codes:        Jalin Erpelding A Kyro Joswick, PT 05/30/2017, 4:21 PM

## 2017-05-30 NOTE — ED Provider Notes (Signed)
[facility] Emergency Department Provider Note   ____________________________________________   First MD Initiated Contact with Patient 05/30/17 [id number]     (approximate)  I have reviewed the triage vital signs and the nursing notes.   HISTORY  Chief Complaint Numbness    HPI Kristina [name] is a 46 y.o. female woke up about 6:30 this morning feeling of numbness over the left face and left hand.   Reports that she awoke with the symptoms. Went to bed at about 1:30 or 2 in the morning and did not have symptoms at that time. Reports a burning tingling slightly numb feeling primarily over the left shoulder and the left hand, but also some slight discomfort in the left side of the neck and a slight feeling of tingling over the left face though she reports she is not certain. Symptoms seem to be slightly improved now. No chest pain or trouble breathing. No nausea or vomiting. No headache. Has not had any trouble speaking, has not noticed her face seems weak or droopy, no trouble walking and reports no problems using either hand or legs.  No previous medical history. Reports his never happened before. She does report previous surgery in 1993 and her right ankle  She has a party with about 50-100 people coming her house today, reports she's been moving boxes quite a bit for the last week and see have recently been married and moved to new home and believes this may be related.  Medications Report only taking oxycodone as needed for chronic ankle pain  History reviewed. No pertinent past medical history.  Patient Active Problem List   Diagnosis Date Noted  . Post-operative state 04/13/2015    Past Surgical History:  Procedure Laterality Date  . ABDOMINAL HYSTERECTOMY     04/13/15  . CHOLECYSTECTOMY    . FRACTURE SURGERY Right    ankle fusion  . LEEP    . SALPINGOOPHORECTOMY Bilateral 04/13/2015   Procedure: SALPINGO OOPHORECTOMY;  Surgeon: Suzy Bouchard, MD;  Location: [facility] ORS;  Service: Gynecology;  Laterality: Bilateral;  . TONSILLECTOMY    . VAGINAL HYSTERECTOMY N/A 04/13/2015   Procedure: HYSTERECTOMY VAGINAL;  Surgeon: Suzy Bouchard, MD;  Location: [facility] ORS;  Service: Gynecology;  Laterality: N/A;      Allergies Patient has no known allergies.  Family History  Problem Relation Age of Onset  . Breast cancer Maternal Uncle 30    Social History Social History  Substance Use Topics  . Smoking status: Never Smoker  . Smokeless tobacco: Never Used  . Alcohol use No    Review of Systems Constitutional: No fever/chills Eyes: No visual changes. ENT: No sore throat. Cardiovascular: Denies chest pain. Respiratory: Denies shortness of breath. Gastrointestinal: No abdominal pain.  No nausea, no vomiting.  No diarrhea.  No constipation. Genitourinary: Negative for dysuria. Musculoskeletal: Negative for back pain. Skin: Negative for rash. Neurological: Negative for headachesOr weakness. See history of present illness    ____________________________________________   PHYSICAL EXAM:  VITAL SIGNS: ED Triage Vitals [05/30/17 0803]  Enc Vitals Group     BP (!) 143/67     Pulse Rate 91     Resp 16     Temp 98.4 F (36.9 C)     Temp Source Oral     SpO2 100 %     Weight 195 lb (88.5 kg)     Height      Head Circumference      Peak Flow  Pain Score      Pain Loc      Pain Edu?      Excl. in GC?     Constitutional: Alert and oriented. Well appearing and in no acute distress. Eyes: Conjunctivae are normal. Head: Atraumatic. Nose: No congestion/rhinnorhea. Mouth/Throat: Mucous membranes are moist. Neck: No stridor.  No cervical spine tenderness. Movement of the neck does not worsen or aggravate symptoms. Cardiovascular: Normal rate, regular rhythm. Grossly normal heart sounds.  Good peripheral circulation. Respiratory: Normal respiratory effort.  No retractions. Lungs CTAB. Gastrointestinal:  Soft and nontender. No distention. Musculoskeletal: No lower extremity tenderness nor edema. Neurologic:  NIH score equals 1, performed by me at bedside. The patient has no pronator drift. The patient has normal cranial nerve exam except for slight decrease in sensation over the left side of the face. Extraocular movements are normal. Visual fields are normal. Patient has 5 out of 5 strength in all extremities. There is no numbness or gross, acute sensory abnormality in the extremities bilaterally except the patient reports decreased sensation over the left shoulder down to the fingers of the left arm. No speech disturbance. No dysarthria. No aphasia. No ataxia. Normal finger nose finger bilat. Patient speaking in full and clear sentences. Strong and intact radial pulse in the left arm. No evidence of ischemia distal.  Skin:  Skin is warm, dry and intact. No rash noted. Psychiatric: Mood and affect are normal. Speech and behavior are normal.  ____________________________________________   LABS (all labs ordered are listed, but only abnormal results are displayed)  Labs Reviewed  BASIC METABOLIC PANEL - Abnormal; Notable for the following:       Result Value   Potassium 3.3 (*)    All other components within normal limits  CBC   ____________________________________________  EKG  ED ECG REPORT I, Sierria Bruney, the attending physician, personally viewed and interpreted this ECG.  Date: 05/30/2017 EKG Time: 8:15 Rate: 85 Rhythm: normal sinus rhythm QRS Axis: normal Intervals: normal ST/T Wave abnormalities: normal Narrative Interpretation: unremarkable  ____________________________________________  RADIOLOGY  Mr Brain Wo Contrast  Result Date: 05/30/2017 CLINICAL DATA:  Numbness in LEFT arm and face, last seen normal earlier today. No motor involvement. Concern for cerebral infarction. EXAM: MRI HEAD WITHOUT CONTRAST MRI CERVICAL SPINE WITHOUT CONTRAST TECHNIQUE:  Multiplanar, multiecho pulse sequences of the brain and surrounding structures, and cervical spine, to include the craniocervical junction and cervicothoracic junction, were obtained without intravenous contrast. COMPARISON:  None. FINDINGS: MRI HEAD FINDINGS Brain: There is a small focus of restricted diffusion, RIGHT lateral thalamus, seen on both axial and coronal diffusion weighted images, corresponding low ADC, consistent with acute infarction. No hemorrhage, mass lesion, hydrocephalus, or extra-axial collection. Normal for age cerebral volume. Small foci of subcortical white matter signal abnormality, nonspecific, could represent early chronic microvascular ischemic change. Vascular: Normal flow voids. Skull and upper cervical spine: Normal marrow signal. Sinuses/Orbits: Negative. Other: None. MRI CERVICAL SPINE FINDINGS Alignment: Physiologic. Vertebrae: No fracture, evidence of discitis, or bone lesion. Cord: Normal signal and morphology. Posterior Fossa, vertebral arteries, paraspinal tissues: Negative. Disc levels: Annular bulge at C4-5. Hypoplastic C5-C6 disc space.No disc protrusion or spinal stenosis. No neural impingement. IMPRESSION: Small focus of restricted diffusion RIGHT lateral thalamus consistent with acute nonhemorrhagic infarction. This correlates with the reported sensory symptoms. Suspected early chronic microvascular ischemic change in the white matter. Unremarkable cervical spine imaging. No LEFT-sided neural impingement is evident. Electronically Signed   By: Elsie Stain M.D.   On: 05/30/2017  10:45   Mr Cervical Spine Wo Contrast  Result Date: 05/30/2017 CLINICAL DATA:  Numbness in LEFT arm and face, last seen normal earlier today. No motor involvement. Concern for cerebral infarction. EXAM: MRI HEAD WITHOUT CONTRAST MRI CERVICAL SPINE WITHOUT CONTRAST TECHNIQUE: Multiplanar, multiecho pulse sequences of the brain and surrounding structures, and cervical spine, to include the  craniocervical junction and cervicothoracic junction, were obtained without intravenous contrast. COMPARISON:  None. FINDINGS: MRI HEAD FINDINGS Brain: There is a small focus of restricted diffusion, RIGHT lateral thalamus, seen on both axial and coronal diffusion weighted images, corresponding low ADC, consistent with acute infarction. No hemorrhage, mass lesion, hydrocephalus, or extra-axial collection. Normal for age cerebral volume. Small foci of subcortical white matter signal abnormality, nonspecific, could represent early chronic microvascular ischemic change. Vascular: Normal flow voids. Skull and upper cervical spine: Normal marrow signal. Sinuses/Orbits: Negative. Other: None. MRI CERVICAL SPINE FINDINGS Alignment: Physiologic. Vertebrae: No fracture, evidence of discitis, or bone lesion. Cord: Normal signal and morphology. Posterior Fossa, vertebral arteries, paraspinal tissues: Negative. Disc levels: Annular bulge at C4-5. Hypoplastic C5-C6 disc space.No disc protrusion or spinal stenosis. No neural impingement. IMPRESSION: Small focus of restricted diffusion RIGHT lateral thalamus consistent with acute nonhemorrhagic infarction. This correlates with the reported sensory symptoms. Suspected early chronic microvascular ischemic change in the white matter. Unremarkable cervical spine imaging. No LEFT-sided neural impingement is evident. Electronically Signed   By: Elsie StainJohn T Curnes M.D.   On: 05/30/2017 10:45    ____________________________________________   PROCEDURES  Procedure(s) performed: None  Procedures  Critical Care performed: Yes, see critical care note(s)  CRITICAL CARE Performed by: Sharyn CreamerQUALE, Kindel Rochefort   Total critical care time: 37 minutes  Critical care time was exclusive of separately billable procedures and treating other patients.  Critical care was necessary to treat or prevent imminent or life-threatening deterioration.  Critical care was time spent personally by me on the  following activities: development of treatment plan with patient and/or surrogate as well as nursing, discussions with consultants, evaluation of patient's response to treatment, examination of patient, obtaining history from patient or surrogate, ordering and performing treatments and interventions, ordering and review of laboratory studies, ordering and review of radiographic studies, pulse oximetry and re-evaluation of patient's condition.  ____________________________________________   INITIAL IMPRESSION / ASSESSMENT AND PLAN / ED COURSE  Pertinent labs & imaging results that were available during my care of the patient were reviewed by me and considered in my medical decision making (see chart for details).  Patient presents for paresthesia with a decreased sensation and burning feeling that seems to radiate from the base the neck down towards the left hand, but also reports as feeling in the left side of the face. There are no motor deficits noted. Her NIH score is 1, and she is well outside the TPA window with symptoms starting when she woke up around 6:30 this morning, but was last seen normal at about 1:30 AM. She has no evidence of large vessel occlusion by exam. I most suspect this is likely some type of radiculopathy, possibly a pinched nerve, or other neuropathic type of discomfort however I will obtain an MRI of the brain to exclude stroke.  She has no associated cardiac or pulmonary symptoms. EKG is normal. She has no significant risk factor for stroke. She has normal motor exam    ----------------------------------------- 9:06 AM on 05/30/2017 -----------------------------------------  He recommended consultation with neurologist regarding her symptoms, but the patient reports that she does not wish to see  a neurologist today but would rather do this outpatient, but she is agreeable to obtain MRI of the brain and neck still. I discussed with her my recommendation, and he assured  medical decision making we'll proceed with MRI and not have a total neuro consult at this time unless MRI shows abnormality. She continues report a slight paresthesia in the left shoulder and left hand region at this time.  Of note, she seems to have no risk factor for stroke. Her symptoms sound more radicular in etiology than central or strokelike to my examination at this time.  ----------------------------------------- 11:23 AM on 05/30/2017 -----------------------------------------  Patient MRI reveals acute stroke. Discussed with the patient and she is now agreeable to speaking with neurology. I placed a tele-neurology consultation, discussed with the hospitalist and the patient will be admitted. Aspirin administered. Patient reports no change in symptoms, does have ongoing tingling feeling on the left arm and left face. Reexam shows no motor deficits, cranial nerve exam normal except for mild decreased sensation over the left face. No evidence of large vessel occlusion at this time ____________________________________________   FINAL CLINICAL IMPRESSION(S) / ED DIAGNOSES  Final diagnoses:  Acute ischemic stroke (HCC)      NEW MEDICATIONS STARTED DURING THIS VISIT:  New Prescriptions   No medications on file     Note:  This document was prepared using Dragon voice recognition software and may include unintentional dictation errors.     Sharyn Creamer, MD 05/30/17 1123

## 2017-05-30 NOTE — ED Notes (Signed)
Admitting MD at bedside.

## 2017-05-31 ENCOUNTER — Inpatient Hospital Stay
Admit: 2017-05-31 | Discharge: 2017-05-31 | Disposition: A | Discharge status: Home / Self Care | Payer: BC Managed Care – PPO | Attending: Internal Medicine | Admitting: Internal Medicine

## 2017-05-31 LAB — URINE DRUG SCREEN, QUALITATIVE (ARMC ONLY)
AMPHETAMINES, UR SCREEN: NOT DETECTED
BENZODIAZEPINE, UR SCRN: NOT DETECTED
Barbiturates, Ur Screen: NOT DETECTED
Cannabinoid 50 Ng, Ur ~~LOC~~: NOT DETECTED
Cocaine Metabolite,Ur ~~LOC~~: NOT DETECTED
MDMA (ECSTASY) UR SCREEN: NOT DETECTED
METHADONE SCREEN, URINE: NOT DETECTED
Opiate, Ur Screen: NOT DETECTED
PHENCYCLIDINE (PCP) UR S: NOT DETECTED
TRICYCLIC, UR SCREEN: NOT DETECTED

## 2017-05-31 LAB — URINALYSIS, COMPLETE (UACMP) WITH MICROSCOPIC
BACTERIA UA: NONE SEEN
Bilirubin Urine: NEGATIVE
GLUCOSE, UA: NEGATIVE mg/dL
KETONES UR: 5 mg/dL — AB
LEUKOCYTES UA: NEGATIVE
Nitrite: NEGATIVE
PROTEIN: NEGATIVE mg/dL
Specific Gravity, Urine: 1.01 (ref 1.005–1.030)
pH: 6 (ref 5.0–8.0)

## 2017-05-31 LAB — LIPID PANEL
CHOLESTEROL: 136 mg/dL (ref 0–200)
HDL: 41 mg/dL (ref >40)
LDL CALC: 76 mg/dL (ref 0–99)
TRIGLYCERIDES: 93 mg/dL (ref <150)
Total CHOL/HDL Ratio: 3.3 RATIO
VLDL: 19 mg/dL (ref 0–40)

## 2017-05-31 LAB — ANTITHROMBIN III: ANTITHROMB III FUNC: 96 % (ref 75–120)

## 2017-05-31 MED ORDER — ATORVASTATIN CALCIUM 20 MG PO TABS
40.0000 mg | ORAL_TABLET | Freq: Every day | ORAL | Status: DC
  Administered 2017-05-31: 16:00:00 40 mg via ORAL
  Filled 2017-05-31: qty 2

## 2017-05-31 MED ORDER — TRAMADOL HCL 50 MG PO TABS: 50.0000 mg | ORAL_TABLET | Freq: Four times a day (QID) | ORAL | Status: DC | PRN

## 2017-05-31 MED ORDER — ZOLPIDEM TARTRATE 5 MG PO TABS
5.0000 mg | ORAL_TABLET | Freq: Once | ORAL | Status: AC
  Administered 2017-05-31: 23:00:00 5 mg via ORAL
  Filled 2017-05-31: qty 1

## 2017-05-31 NOTE — Progress Notes (Signed)
*  PRELIMINARY RESULTS* Echocardiogram 2D Echocardiogram has been performed. Saline Microcavitation (Bubble Study) was requested and performed at the bedside on this exam.  Garrel Ridgelikeshia S Adlai Nieblas 05/31/2017, 1:35 PM

## 2017-05-31 NOTE — Consult Note (Signed)
[facility] Cardiology Consultation Note  Patient ID: SONIA BROMELL, MRN: [id number], DOB/AGE: 1971-07-03 45 y.o. Admit date: 05/30/2017   Date of Consult: 05/31/2017 Primary Physician: Patient, No Pcp Per Primary Cardiologist: None  Chief Complaint:  Chief Complaint  Patient presents with  . Numbness   Reason for Consult: stroke  HPI: 46 y.o. female with known cardiovascular history who is had new onset of the left sided numbness burning consistent with a TIA and/or stroke. The patient still has some burning and numbness but significantly improved since Saturday. The patient has had a CAT scan showing a probable stroke and no evidence of primary source. Carotid Dopplers showed no evidence of atherosclerosis and the patient does not have any significant hypertension hyperlipidemia or diabetes. The patient did have an echocardiogram showing normal LV systolic function with ejection fraction of 60% and normal heart valves. Bubble study suggested a bidirectional shunt of the atrium consistent with possible atrial septal defect. There is been no evidence of murmur or any other significant concerns of symptoms of shortness of breath. We have discussed at length further treatment options and diagnostic testing  History reviewed. No pertinent past medical history.    Surgical History:  Past Surgical History:  Procedure Laterality Date  . ABDOMINAL HYSTERECTOMY     04/13/15  . CHOLECYSTECTOMY    . FRACTURE SURGERY Right    ankle fusion  . LEEP    . SALPINGOOPHORECTOMY Bilateral 04/13/2015   Procedure: SALPINGO OOPHORECTOMY;  Surgeon: Suzy Bouchard, MD;  Location: [facility] ORS;  Service: Gynecology;  Laterality: Bilateral;  . TONSILLECTOMY    . VAGINAL HYSTERECTOMY N/A 04/13/2015   Procedure: HYSTERECTOMY VAGINAL;  Surgeon: Suzy Bouchard, MD;  Location: [facility] ORS;  Service: Gynecology;  Laterality: N/A;     Home Meds: Prior to Admission medications   Medication Sig Start Date  End Date Taking? Authorizing Provider  oxyCODONE-acetaminophen (PERCOCET/ROXICET) 5-325 MG per tablet Take 2 tablets by mouth every 4 (four) hours as needed for moderate pain. 04/14/15  Yes Schermerhorn, Ihor Austin, MD  docusate sodium (COLACE) 50 MG capsule Take 1 capsule (50 mg total) by mouth 2 (two) times daily. Patient not taking: Reported on 05/30/2017 04/14/15   Schermerhorn, Ihor Austin, MD  naproxen (NAPROSYN) 500 MG tablet Take 1 tablet (500 mg total) by mouth 2 (two) times daily with a meal. Patient not taking: Reported on 05/30/2017 04/14/15   Schermerhorn, Ihor Austin, MD    Inpatient Medications:  . aspirin  325 mg Oral Daily  . atorvastatin  40 mg Oral q1800  . enoxaparin (LOVENOX) injection  40 mg Subcutaneous Q24H  . gabapentin  300 mg Oral TID     Allergies: No Known Allergies  Social History   Social History  . Marital status: Divorced    Spouse name: N/A  . Number of children: N/A  . Years of education: N/A   Occupational History  . Not on file.   Social History Main Topics  . Smoking status: Never Smoker  . Smokeless tobacco: Never Used  . Alcohol use No  . Drug use: No  . Sexual activity: Not Currently   Other Topics Concern  . Not on file   Social History Narrative  . No narrative on file     Family History  Problem Relation Age of Onset  . Breast cancer Maternal Uncle 30     Review of Systems Positive for Stroke like symptoms Negative for: General:  chills, fever, night sweats or weight  changes.  Cardiovascular: PND orthopnea syncope dizziness  Dermatological skin lesions rashes Respiratory: Cough congestion Urologic: Frequent urination urination at night and hematuria Abdominal: negative for nausea, vomiting, diarrhea, bright red blood per rectum, melena, or hematemesis Neurologic: negative for visual changes, and/or hearing changes  All other systems reviewed and are otherwise negative except as noted above.  Labs: No results for input(s):  CKTOTAL, CKMB, TROPONINI in the last 72 hours. Lab Results  Component Value Date   WBC 4.6 05/30/2017   HGB 12.6 05/30/2017   HCT 36.7 05/30/2017   MCV 92.3 05/30/2017   PLT 206 05/30/2017    Recent Labs Lab 05/30/17 0821  NA 137  K 3.3*  CL 107  CO2 24  BUN 15  CREATININE 0.83  CALCIUM 9.0  GLUCOSE 99   Lab Results  Component Value Date   CHOL 136 05/31/2017   HDL 41 05/31/2017   LDLCALC 76 05/31/2017   TRIG 93 05/31/2017   No results found for: DDIMER  Radiology/Studies:  Mr Brain Wo Contrast  Result Date: 05/30/2017 CLINICAL DATA:  Numbness in LEFT arm and face, last seen normal earlier today. No motor involvement. Concern for cerebral infarction. EXAM: MRI HEAD WITHOUT CONTRAST MRI CERVICAL SPINE WITHOUT CONTRAST TECHNIQUE: Multiplanar, multiecho pulse sequences of the brain and surrounding structures, and cervical spine, to include the craniocervical junction and cervicothoracic junction, were obtained without intravenous contrast. COMPARISON:  None. FINDINGS: MRI HEAD FINDINGS Brain: There is a small focus of restricted diffusion, RIGHT lateral thalamus, seen on both axial and coronal diffusion weighted images, corresponding low ADC, consistent with acute infarction. No hemorrhage, mass lesion, hydrocephalus, or extra-axial collection. Normal for age cerebral volume. Small foci of subcortical white matter signal abnormality, nonspecific, could represent early chronic microvascular ischemic change. Vascular: Normal flow voids. Skull and upper cervical spine: Normal marrow signal. Sinuses/Orbits: Negative. Other: None. MRI CERVICAL SPINE FINDINGS Alignment: Physiologic. Vertebrae: No fracture, evidence of discitis, or bone lesion. Cord: Normal signal and morphology. Posterior Fossa, vertebral arteries, paraspinal tissues: Negative. Disc levels: Annular bulge at C4-5. Hypoplastic C5-C6 disc space.No disc protrusion or spinal stenosis. No neural impingement. IMPRESSION: Small focus  of restricted diffusion RIGHT lateral thalamus consistent with acute nonhemorrhagic infarction. This correlates with the reported sensory symptoms. Suspected early chronic microvascular ischemic change in the white matter. Unremarkable cervical spine imaging. No LEFT-sided neural impingement is evident. Electronically Signed   By: Elsie StainJohn T Curnes M.D.   On: 05/30/2017 10:45   Mr Cervical Spine Wo Contrast  Result Date: 05/30/2017 CLINICAL DATA:  Numbness in LEFT arm and face, last seen normal earlier today. No motor involvement. Concern for cerebral infarction. EXAM: MRI HEAD WITHOUT CONTRAST MRI CERVICAL SPINE WITHOUT CONTRAST TECHNIQUE: Multiplanar, multiecho pulse sequences of the brain and surrounding structures, and cervical spine, to include the craniocervical junction and cervicothoracic junction, were obtained without intravenous contrast. COMPARISON:  None. FINDINGS: MRI HEAD FINDINGS Brain: There is a small focus of restricted diffusion, RIGHT lateral thalamus, seen on both axial and coronal diffusion weighted images, corresponding low ADC, consistent with acute infarction. No hemorrhage, mass lesion, hydrocephalus, or extra-axial collection. Normal for age cerebral volume. Small foci of subcortical white matter signal abnormality, nonspecific, could represent early chronic microvascular ischemic change. Vascular: Normal flow voids. Skull and upper cervical spine: Normal marrow signal. Sinuses/Orbits: Negative. Other: None. MRI CERVICAL SPINE FINDINGS Alignment: Physiologic. Vertebrae: No fracture, evidence of discitis, or bone lesion. Cord: Normal signal and morphology. Posterior Fossa, vertebral arteries, paraspinal tissues: Negative. Disc levels: Annular  bulge at C4-5. Hypoplastic C5-C6 disc space.No disc protrusion or spinal stenosis. No neural impingement. IMPRESSION: Small focus of restricted diffusion RIGHT lateral thalamus consistent with acute nonhemorrhagic infarction. This correlates with the  reported sensory symptoms. Suspected early chronic microvascular ischemic change in the white matter. Unremarkable cervical spine imaging. No LEFT-sided neural impingement is evident. Electronically Signed   By: Elsie Stain M.D.   On: 05/30/2017 10:45   US Carotid Bilateral (at [facility] And Ap Only)  Result Date: 05/31/2017 CLINICAL DATA:  Left arm paresthesias EXAM: BILATERAL CAROTID DUPLEX ULTRASOUND TECHNIQUE: Wallace Cullens scale imaging, color Doppler and duplex ultrasound were performed of bilateral carotid and vertebral arteries in the neck. COMPARISON:  05/30/2017 FINDINGS: Criteria: Quantification of carotid stenosis is based on velocity parameters that correlate the residual internal carotid diameter with NASCET-based stenosis levels, using the diameter of the distal internal carotid lumen as the denominator for stenosis measurement. The following velocity measurements were obtained: RIGHT ICA:  117/51 cm/sec CCA:  117/19 cm/sec SYSTOLIC ICA/CCA RATIO:  1.0 DIASTOLIC ICA/CCA RATIO:  2.7 ECA:  80 cm/sec LEFT ICA:  116/44 cm/sec CCA:  117/29 cm/sec SYSTOLIC ICA/CCA RATIO:  1.0 DIASTOLIC ICA/CCA RATIO:  1.5 ECA:  93 cm/sec RIGHT CAROTID ARTERY: No significant carotid atherosclerosis. No hemodynamically significant right ICA stenosis, velocity elevation, or turbulent flow. Degree of narrowing less than 50%. RIGHT VERTEBRAL ARTERY:  Antegrade LEFT CAROTID ARTERY: No significant carotid atherosclerosis. No hemodynamically significant left ICA stenosis, velocity elevation, or turbulent flow. LEFT VERTEBRAL ARTERY:  Antegrade IMPRESSION: No significant carotid atherosclerosis or ICA stenosis. Degree of narrowing is less than 50% bilaterally by ultrasound criteria. Patent antegrade vertebral flow bilaterally Electronically Signed   By: Judie Petit.  Shick M.D.   On: 05/31/2017 08:06   Mr Maxine Glenn Head/brain ZO Cm  Result Date: 05/30/2017 CLINICAL DATA:  Acute RIGHT thalamus infarct. EXAM: MRA HEAD WITHOUT CONTRAST TECHNIQUE:  Angiographic images of the Circle of Willis were obtained using MRA technique without intravenous contrast. COMPARISON:  MRI brain earlier today. FINDINGS: The internal carotid arteries are widely patent. The basilar artery is widely patent with vertebrals codominant. There is no visible intracranial stenosis or saccular aneurysm. IMPRESSION: Negative MRA intracranial circulation. Electronically Signed   By: Elsie Stain M.D.   On: 05/30/2017 15:30    EKG: Normal sinus rhythm  Weights: Filed Weights   05/30/17 0803 05/31/17 0203  Weight: 88.5 kg (195 lb) 88.5 kg (195 lb)     Physical Exam: Blood pressure 113/74, pulse 81, temperature 98.1 F (36.7 C), temperature source Oral, resp. rate 18, height 5\' 8"  (1.727 m), weight 88.5 kg (195 lb), last menstrual period 03/28/2015, SpO2 100 %. Body mass index is 29.65 kg/m. General: Well developed, well nourished, in no acute distress. Head eyes ears nose throat: Normocephalic, atraumatic, sclera non-icteric, no xanthomas, nares are without discharge. No apparent thyromegaly and/or mass  Lungs: Normal respiratory effort.  no wheezes, no rales, no rhonchi.  Heart: RRR with normal S1 S2. no murmur gallop, no rub, PMI is normal size and placement, carotid upstroke normal without bruit, jugular venous pressure is normal Abdomen: Soft, non-tender, non-distended with normoactive bowel sounds. No hepatomegaly. No rebound/guarding. No obvious abdominal masses. Abdominal aorta is normal size without bruit Extremities: No edema. no cyanosis, no clubbing, no ulcers  Peripheral : 2+ bilateral upper extremity pulses, 2+ bilateral femoral pulses, 2+ bilateral dorsal pedal pulse Neuro: Alert and oriented. No facial asymmetry. No focal deficit. Moves all extremities spontaneously. Musculoskeletal: Normal muscle tone without kyphosis Psych:  Responds to questions appropriately with a normal affect.    Assessment: 46 year old female with no evidence cardiovascular  side effects and/or symptoms with acute stroke any further evaluation and treatment options including the possibility of atrial septal defect and or patent foramen ovale  Plan: 1. Continue antiplatelet medication management including aspirin 2. Transesophageal echocardiogram for further evaluation of valvular heart disease atrial septal defect and/or source of the stroke with further treatment options thereof after. Patient understands risk and benefits of transesophageal echocardiogram this includes a possibility does stroke heart attack side effects of medications esophageal spasm or sore throat and esophageal perforation. The patient is at low risk for conscious sedation  Signed, Lamar Blinks M.D. [facility] Cardiology 05/31/2017, 6:51 PM

## 2017-05-31 NOTE — Progress Notes (Signed)
[name], female   DOB: [birth year], 46 y.o.   MRN: [id number] Spoke with Dr Gwen PoundsKowalski cardiology to read echo with bubble study. If this is negative, the patient will need a TEE tomorrow. He will order.  Dr Renae GlossWieting

## 2017-05-31 NOTE — Progress Notes (Signed)
[name], female   DOB: 11-07-1971, 46 y.o.   MRN: [id number]  [employer] PROGRESS NOTE  Kristina [name]:[id number] DOB: [birth year] DOA: 05/30/2017 PCP: Patient, No Pcp Per  HPI/Subjective: Patient with some numbness in the left arm.  Found to have an acute stroke in the right thalamus.  Objective: Vitals:   05/31/17 0746 05/31/17 1215  BP: 111/75 98/71  Pulse: 80 83  Resp:    Temp: 97.6 F (36.4 C)     Filed Weights   05/30/17 0803 05/31/17 0203  Weight: 88.5 kg (195 lb) 88.5 kg (195 lb)    ROS: Review of Systems  Constitutional: Negative for chills and fever.  Eyes: Negative for blurred vision.  Respiratory: Negative for cough and shortness of breath.   Cardiovascular: Negative for chest pain.  Gastrointestinal: Negative for abdominal pain, constipation, diarrhea, nausea and vomiting.  Genitourinary: Negative for dysuria.  Musculoskeletal: Negative for joint pain.  Neurological: Positive for tingling and sensory change. Negative for dizziness and headaches.   Exam: Physical Exam  Constitutional: She is oriented to person, place, and time.  HENT:  Nose: No mucosal edema.  Mouth/Throat: No oropharyngeal exudate or posterior oropharyngeal edema.  Eyes: Pupils are equal, round, and reactive to light. Conjunctivae, EOM and lids are normal.  Neck: No JVD present. Carotid bruit is not present. No edema present. No thyroid mass and no thyromegaly present.  Cardiovascular: S1 normal and S2 normal.  Exam reveals no gallop.   No murmur heard. Pulses:      Dorsalis pedis pulses are 2+ on the right side, and 2+ on the left side.  Respiratory: No respiratory distress. She has no wheezes. She has no rhonchi. She has no rales.  GI: Soft. Bowel sounds are normal. There is no tenderness.  Musculoskeletal:       Right shoulder: She exhibits no swelling.  Lymphadenopathy:    She has no cervical adenopathy.  Neurological: She is alert and oriented to person,  place, and time. She has normal strength. No cranial nerve deficit. She displays no Babinski's sign on the right side. She displays no Babinski's sign on the left side.  Sensation left arm to light touch decreased as per patient. Left face also decreased as per patient.  Skin: Skin is warm. No rash noted. Nails show no clubbing.  Psychiatric: She has a normal mood and affect.      Data Reviewed: Basic Metabolic Panel:  Recent Labs Lab 05/30/17 0821  NA 137  K 3.3*  CL 107  CO2 24  GLUCOSE 99  BUN 15  CREATININE 0.83  CALCIUM 9.0   CBC:  Recent Labs Lab 05/30/17 0821  WBC 4.6  HGB 12.6  HCT 36.7  MCV 92.3  PLT 206     Studies: Mr Brain Wo Contrast  Result Date: 05/30/2017 CLINICAL DATA:  Numbness in LEFT arm and face, last seen normal earlier today. No motor involvement. Concern for cerebral infarction. EXAM: MRI HEAD WITHOUT CONTRAST MRI CERVICAL SPINE WITHOUT CONTRAST TECHNIQUE: Multiplanar, multiecho pulse sequences of the brain and surrounding structures, and cervical spine, to include the craniocervical junction and cervicothoracic junction, were obtained without intravenous contrast. COMPARISON:  None. FINDINGS: MRI HEAD FINDINGS Brain: There is a small focus of restricted diffusion, RIGHT lateral thalamus, seen on both axial and coronal diffusion weighted images, corresponding low ADC, consistent with acute infarction. No hemorrhage, mass lesion, hydrocephalus, or extra-axial collection. Normal for age cerebral volume. Small foci of subcortical white matter  signal abnormality, nonspecific, could represent early chronic microvascular ischemic change. Vascular: Normal flow voids. Skull and upper cervical spine: Normal marrow signal. Sinuses/Orbits: Negative. Other: None. MRI CERVICAL SPINE FINDINGS Alignment: Physiologic. Vertebrae: No fracture, evidence of discitis, or bone lesion. Cord: Normal signal and morphology. Posterior Fossa, vertebral arteries, paraspinal tissues:  Negative. Disc levels: Annular bulge at C4-5. Hypoplastic C5-C6 disc space.No disc protrusion or spinal stenosis. No neural impingement. IMPRESSION: Small focus of restricted diffusion RIGHT lateral thalamus consistent with acute nonhemorrhagic infarction. This correlates with the reported sensory symptoms. Suspected early chronic microvascular ischemic change in the white matter. Unremarkable cervical spine imaging. No LEFT-sided neural impingement is evident. Electronically Signed   By: Elsie Stain M.D.   On: 05/30/2017 10:45   Mr Cervical Spine Wo Contrast  Result Date: 05/30/2017 CLINICAL DATA:  Numbness in LEFT arm and face, last seen normal earlier today. No motor involvement. Concern for cerebral infarction. EXAM: MRI HEAD WITHOUT CONTRAST MRI CERVICAL SPINE WITHOUT CONTRAST TECHNIQUE: Multiplanar, multiecho pulse sequences of the brain and surrounding structures, and cervical spine, to include the craniocervical junction and cervicothoracic junction, were obtained without intravenous contrast. COMPARISON:  None. FINDINGS: MRI HEAD FINDINGS Brain: There is a small focus of restricted diffusion, RIGHT lateral thalamus, seen on both axial and coronal diffusion weighted images, corresponding low ADC, consistent with acute infarction. No hemorrhage, mass lesion, hydrocephalus, or extra-axial collection. Normal for age cerebral volume. Small foci of subcortical white matter signal abnormality, nonspecific, could represent early chronic microvascular ischemic change. Vascular: Normal flow voids. Skull and upper cervical spine: Normal marrow signal. Sinuses/Orbits: Negative. Other: None. MRI CERVICAL SPINE FINDINGS Alignment: Physiologic. Vertebrae: No fracture, evidence of discitis, or bone lesion. Cord: Normal signal and morphology. Posterior Fossa, vertebral arteries, paraspinal tissues: Negative. Disc levels: Annular bulge at C4-5. Hypoplastic C5-C6 disc space.No disc protrusion or spinal stenosis. No  neural impingement. IMPRESSION: Small focus of restricted diffusion RIGHT lateral thalamus consistent with acute nonhemorrhagic infarction. This correlates with the reported sensory symptoms. Suspected early chronic microvascular ischemic change in the white matter. Unremarkable cervical spine imaging. No LEFT-sided neural impingement is evident. Electronically Signed   By: Elsie Stain M.D.   On: 05/30/2017 10:45   US Carotid Bilateral (at [facility] And Ap Only)  Result Date: 05/31/2017 CLINICAL DATA:  Left arm paresthesias EXAM: BILATERAL CAROTID DUPLEX ULTRASOUND TECHNIQUE: Wallace Cullens scale imaging, color Doppler and duplex ultrasound were performed of bilateral carotid and vertebral arteries in the neck. COMPARISON:  05/30/2017 FINDINGS: Criteria: Quantification of carotid stenosis is based on velocity parameters that correlate the residual internal carotid diameter with NASCET-based stenosis levels, using the diameter of the distal internal carotid lumen as the denominator for stenosis measurement. The following velocity measurements were obtained: RIGHT ICA:  117/51 cm/sec CCA:  117/19 cm/sec SYSTOLIC ICA/CCA RATIO:  1.0 DIASTOLIC ICA/CCA RATIO:  2.7 ECA:  80 cm/sec LEFT ICA:  116/44 cm/sec CCA:  117/29 cm/sec SYSTOLIC ICA/CCA RATIO:  1.0 DIASTOLIC ICA/CCA RATIO:  1.5 ECA:  93 cm/sec RIGHT CAROTID ARTERY: No significant carotid atherosclerosis. No hemodynamically significant right ICA stenosis, velocity elevation, or turbulent flow. Degree of narrowing less than 50%. RIGHT VERTEBRAL ARTERY:  Antegrade LEFT CAROTID ARTERY: No significant carotid atherosclerosis. No hemodynamically significant left ICA stenosis, velocity elevation, or turbulent flow. LEFT VERTEBRAL ARTERY:  Antegrade IMPRESSION: No significant carotid atherosclerosis or ICA stenosis. Degree of narrowing is less than 50% bilaterally by ultrasound criteria. Patent antegrade vertebral flow bilaterally Electronically Signed   By: Judie Petit.  Shick M.D.  On:  05/31/2017 08:06   Mr Maxine GlennMra Head/brain JXWo Cm  Result Date: 05/30/2017 CLINICAL DATA:  Acute RIGHT thalamus infarct. EXAM: MRA HEAD WITHOUT CONTRAST TECHNIQUE: Angiographic images of the Circle of Willis were obtained using MRA technique without intravenous contrast. COMPARISON:  MRI brain earlier today. FINDINGS: The internal carotid arteries are widely patent. The basilar artery is widely patent with vertebrals codominant. There is no visible intracranial stenosis or saccular aneurysm. IMPRESSION: Negative MRA intracranial circulation. Electronically Signed   By: Elsie StainJohn T Curnes M.D.   On: 05/30/2017 15:30    Scheduled Meds: . aspirin  325 mg Oral Daily  . atorvastatin  40 mg Oral q1800  . enoxaparin (LOVENOX) injection  40 mg Subcutaneous Q24H  . gabapentin  300 mg Oral TID    Assessment/Plan:  1. Acute stroke involving the right thalamus. Patient with left arm sensory disturbance. Case discussed with neurology. Continue aspirin full dose. Increase atorvastatin to high intensity dose. Hypercoagulable workup sent off. So far telemetry monitoring unremarkable but will likely need a loop monitor as outpatient. TTE with bubble study ordered for today. If this is negative, will need a TEE prior to discharge. Neurology also started gabapentin. 2. Relative hypotension. Patient states blood pressures always on the low side.  Code Status:     Code Status Orders        Start     Ordered   05/30/17 1327  Full code  Continuous     05/30/17 1326    Code Status History    Date Active Date Inactive Code Status Order ID Comments User Context   04/13/2015 10:24 AM 04/14/2015  6:38 PM Full Code [id number]  Schermerhorn, Ihor Austinhomas J, MD Inpatient     Family Communication: Family at the bedside Disposition Plan: Potentially home tomorrow  Consultants:  Neurology  Time spent: 30 minutes. Case discussed with neurology.  Alford HighlandWIETING, Deshanta Lady  [employer]

## 2017-05-31 NOTE — Consult Note (Addendum)
Referring Physician: Dr. Letitia Libra    Chief Complaint: Stroke  HPI: Kristina [name] is an 46 y.o. female who woke up with left arm and face tingling and burning. She has no significant PMHx. 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 Symptoms wax and wane.  Date last known well: 7/14 130am tPA Given: no  Interval history 05/31/2017: Patient is stable, no changes overnight. Echo pending. Recommend TEE.   History reviewed. No pertinent past medical history.  Past Surgical History:  Procedure Laterality Date  . ABDOMINAL HYSTERECTOMY     04/13/15  . CHOLECYSTECTOMY    . FRACTURE SURGERY Right    ankle fusion  . LEEP    . SALPINGOOPHORECTOMY Bilateral 04/13/2015   Procedure: SALPINGO OOPHORECTOMY;  Surgeon: Suzy Bouchard, MD;  Location: [facility] ORS;  Service: Gynecology;  Laterality: Bilateral;  . TONSILLECTOMY    . VAGINAL HYSTERECTOMY N/A 04/13/2015   Procedure: HYSTERECTOMY VAGINAL;  Surgeon: Suzy Bouchard, MD;  Location: [facility] ORS;  Service: Gynecology;  Laterality: N/A;    Family History  Problem Relation Age of Onset  . Breast cancer Maternal Uncle 30   Social History:  reports that she has never smoked. She has never used smokeless tobacco. She reports that she does not drink alcohol or use drugs.  Allergies: No Known Allergies  Medications:  Prior to Admission medications   Medication Sig Start Date End Date Taking? Authorizing Provider  oxyCODONE-acetaminophen (PERCOCET/ROXICET) 5-325 MG per tablet Take 2 tablets by mouth every 4 (four) hours as needed for moderate pain. 04/14/15  Yes Schermerhorn, Ihor Austin, MD  docusate sodium (COLACE) 50 MG capsule Take 1 capsule (50 mg total) by mouth 2 (two) times daily. Patient not taking: Reported on 05/30/2017 04/14/15   Schermerhorn, Ihor Austin, MD  naproxen (NAPROSYN) 500 MG tablet Take 1 tablet (500 mg total) by mouth 2 (two) times daily with a meal. Patient not taking: Reported on 05/30/2017 04/14/15    Schermerhorn, Ihor Austin, MD     ROS: No CP, no SOB all others negative  Physical Examination: Blood pressure 98/71, pulse 83, temperature 97.6 F (36.4 C), temperature source Oral, resp. rate 18, height 5\' 8"  (1.727 m), weight 195 lb (88.5 kg), last menstrual period 03/28/2015, SpO2 99 %.  Physical exam: Exam: Gen: NAD, conversant, well nourised, obese, well groomed                     CV: RRR, no MRG. No Carotid Bruits. No peripheral edema, warm, nontender Eyes: Conjunctivae clear without exudates or hemorrhage  Neuro: Detailed Neurologic Exam  Speech:    Speech is normal; fluent and spontaneous with normal comprehension.  Cognition:    The patient is oriented to person, place, and time;     recent and remote memory intact;     language fluent;     normal attention, concentration,     fund of knowledge Cranial Nerves:    The pupils are equal, round, and reactive to light. The fundi are normal and spontaneous venous pulsations are present. Visual fields are full to finger confrontation. Extraocular movements are intact. Left face hyperesthesias. The face is symmetric. The palate elevates in the midline. Hearing intact. Voice is normal. Shoulder shrug is normal. The tongue has normal motion without fasciculations.   Coordination:    Normal finger to nose and heel to shin. Normal rapid alternating movements.   Motor Observation:    No asymmetry, no atrophy, and no involuntary movements  noted. Tone:    Normal muscle tone.    Posture:    Posture is normal. normal erect    Strength:    Strength is V/V in the upper and lower limbs.      Sensation: left arm hyperesthesias     Reflex Exam:  DTR's:    Deep tendon reflexes in the upper and lower extremities are normal bilaterally.   Toes:    The toes are downgoing bilaterally.   Clonus:    Clonus is absent.   Laboratory Studies:  Basic Metabolic Panel:  Recent Labs Lab 05/30/17 0821  NA 137  K 3.3*  CL 107  CO2  24  GLUCOSE 99  BUN 15  CREATININE 0.83  CALCIUM 9.0    Liver Function Tests: No results for input(s): AST, ALT, ALKPHOS, BILITOT, PROT, ALBUMIN in the last 168 hours. No results for input(s): LIPASE, AMYLASE in the last 168 hours. No results for input(s): AMMONIA in the last 168 hours.  CBC:  Recent Labs Lab 05/30/17 0821  WBC 4.6  HGB 12.6  HCT 36.7  MCV 92.3  PLT 206    Cardiac Enzymes: No results for input(s): CKTOTAL, CKMB, CKMBINDEX, TROPONINI in the last 168 hours.  BNP: Invalid input(s): POCBNP  CBG: No results for input(s): GLUCAP in the last 168 hours.  Microbiology: No results found for this or any previous visit.  Coagulation Studies: No results for input(s): LABPROT, INR in the last 72 hours.  Urinalysis:   Recent Labs Lab 05/31/17 1212  COLORURINE YELLOW*  LABSPEC 1.010  PHURINE 6.0  GLUCOSEU NEGATIVE  HGBUR MODERATE*  BILIRUBINUR NEGATIVE  KETONESUR 5*  PROTEINUR NEGATIVE  NITRITE NEGATIVE  LEUKOCYTESUR NEGATIVE    Lipid Panel:    Component Value Date/Time   CHOL 136 05/31/2017 0404   TRIG 93 05/31/2017 0404   HDL 41 05/31/2017 0404   CHOLHDL 3.3 05/31/2017 0404   VLDL 19 05/31/2017 0404   LDLCALC 76 05/31/2017 0404    HgbA1C: No results found for: HGBA1C  Urine Drug Screen:  No results found for: LABOPIA, COCAINSCRNUR, LABBENZ, AMPHETMU, THCU, LABBARB  Alcohol Level: No results for input(s): ETH in the last 168 hours.  Imaging: Mr Brain Wo Contrast  Result Date: 05/30/2017 CLINICAL DATA:  Numbness in LEFT arm and face, last seen normal earlier today. No motor involvement. Concern for cerebral infarction. EXAM: MRI HEAD WITHOUT CONTRAST MRI CERVICAL SPINE WITHOUT CONTRAST TECHNIQUE: Multiplanar, multiecho pulse sequences of the brain and surrounding structures, and cervical spine, to include the craniocervical junction and cervicothoracic junction, were obtained without intravenous contrast. COMPARISON:  None. FINDINGS: MRI  HEAD FINDINGS Brain: There is a small focus of restricted diffusion, RIGHT lateral thalamus, seen on both axial and coronal diffusion weighted images, corresponding low ADC, consistent with acute infarction. No hemorrhage, mass lesion, hydrocephalus, or extra-axial collection. Normal for age cerebral volume. Small foci of subcortical white matter signal abnormality, nonspecific, could represent early chronic microvascular ischemic change. Vascular: Normal flow voids. Skull and upper cervical spine: Normal marrow signal. Sinuses/Orbits: Negative. Other: None. MRI CERVICAL SPINE FINDINGS Alignment: Physiologic. Vertebrae: No fracture, evidence of discitis, or bone lesion. Cord: Normal signal and morphology. Posterior Fossa, vertebral arteries, paraspinal tissues: Negative. Disc levels: Annular bulge at C4-5. Hypoplastic C5-C6 disc space.No disc protrusion or spinal stenosis. No neural impingement. IMPRESSION: Small focus of restricted diffusion RIGHT lateral thalamus consistent with acute nonhemorrhagic infarction. This correlates with the reported sensory symptoms. Suspected early chronic microvascular ischemic change in the white matter.  Unremarkable cervical spine imaging. No LEFT-sided neural impingement is evident. Electronically Signed   By: Elsie StainJohn T Curnes M.D.   On: 05/30/2017 10:45   Mr Cervical Spine Wo Contrast  Result Date: 05/30/2017 CLINICAL DATA:  Numbness in LEFT arm and face, last seen normal earlier today. No motor involvement. Concern for cerebral infarction. EXAM: MRI HEAD WITHOUT CONTRAST MRI CERVICAL SPINE WITHOUT CONTRAST TECHNIQUE: Multiplanar, multiecho pulse sequences of the brain and surrounding structures, and cervical spine, to include the craniocervical junction and cervicothoracic junction, were obtained without intravenous contrast. COMPARISON:  None. FINDINGS: MRI HEAD FINDINGS Brain: There is a small focus of restricted diffusion, RIGHT lateral thalamus, seen on both axial and  coronal diffusion weighted images, corresponding low ADC, consistent with acute infarction. No hemorrhage, mass lesion, hydrocephalus, or extra-axial collection. Normal for age cerebral volume. Small foci of subcortical white matter signal abnormality, nonspecific, could represent early chronic microvascular ischemic change. Vascular: Normal flow voids. Skull and upper cervical spine: Normal marrow signal. Sinuses/Orbits: Negative. Other: None. MRI CERVICAL SPINE FINDINGS Alignment: Physiologic. Vertebrae: No fracture, evidence of discitis, or bone lesion. Cord: Normal signal and morphology. Posterior Fossa, vertebral arteries, paraspinal tissues: Negative. Disc levels: Annular bulge at C4-5. Hypoplastic C5-C6 disc space.No disc protrusion or spinal stenosis. No neural impingement. IMPRESSION: Small focus of restricted diffusion RIGHT lateral thalamus consistent with acute nonhemorrhagic infarction. This correlates with the reported sensory symptoms. Suspected early chronic microvascular ischemic change in the white matter. Unremarkable cervical spine imaging. No LEFT-sided neural impingement is evident. Electronically Signed   By: Elsie StainJohn T Curnes M.D.   On: 05/30/2017 10:45   [place] Carotid Bilateral (at [facility] And Ap Only)  Result Date: 05/31/2017 CLINICAL DATA:  Left arm paresthesias EXAM: BILATERAL CAROTID DUPLEX ULTRASOUND TECHNIQUE: Wallace CullensGray scale imaging, color Doppler and duplex ultrasound were performed of bilateral carotid and vertebral arteries in the neck. COMPARISON:  05/30/2017 FINDINGS: Criteria: Quantification of carotid stenosis is based on velocity parameters that correlate the residual internal carotid diameter with NASCET-based stenosis levels, using the diameter of the distal internal carotid lumen as the denominator for stenosis measurement. The following velocity measurements were obtained: RIGHT ICA:  117/51 cm/sec CCA:  117/19 cm/sec SYSTOLIC ICA/CCA RATIO:  1.0 DIASTOLIC ICA/CCA RATIO:  2.7 ECA:   80 cm/sec LEFT ICA:  116/44 cm/sec CCA:  117/29 cm/sec SYSTOLIC ICA/CCA RATIO:  1.0 DIASTOLIC ICA/CCA RATIO:  1.5 ECA:  93 cm/sec RIGHT CAROTID ARTERY: No significant carotid atherosclerosis. No hemodynamically significant right ICA stenosis, velocity elevation, or turbulent flow. Degree of narrowing less than 50%. RIGHT VERTEBRAL ARTERY:  Antegrade LEFT CAROTID ARTERY: No significant carotid atherosclerosis. No hemodynamically significant left ICA stenosis, velocity elevation, or turbulent flow. LEFT VERTEBRAL ARTERY:  Antegrade IMPRESSION: No significant carotid atherosclerosis or ICA stenosis. Degree of narrowing is less than 50% bilaterally by ultrasound criteria. Patent antegrade vertebral flow bilaterally Electronically Signed   By: Judie PetitM.  Shick M.D.   On: 05/31/2017 08:06   Mr Maxine GlennMra Head/brain JXWo Cm  Result Date: 05/30/2017 CLINICAL DATA:  Acute RIGHT thalamus infarct. EXAM: MRA HEAD WITHOUT CONTRAST TECHNIQUE: Angiographic images of the Circle of Willis were obtained using MRA technique without intravenous contrast. COMPARISON:  MRI brain earlier today. FINDINGS: The internal carotid arteries are widely patent. The basilar artery is widely patent with vertebrals codominant. There is no visible intracranial stenosis or saccular aneurysm. IMPRESSION: Negative MRA intracranial circulation. Electronically Signed   By: Elsie StainJohn T Curnes M.D.   On: 05/30/2017 15:30    Assessment: 46 y.o. female with  no significant PMHx presenting with right thalamic stroke  Plan: 1. HgbA1c pending, fasting lipid panel ldl 76(goal < 70) start Lipitor qhs, hypercoag panel pending 2. MRA of the head unremarkable 3. PT consult, OT consult, Speech consult : Patient has no PT/OT/Speech needs 4. Echocardiogram: TTE pending. Recommend TEE (pending results of TTE). And possibly loop recorder 5. Carotid dopplers: Unremarkable, no significant stenosis 6. Prophylactic therapy: ASA 325mg , continue 7. Telemetry monitoring 8. Frequent  neuro checks 9. NPO after midnight for TEE  Discussed with Dr. Renae Gloss. Will schedule patient appointment outpatient with Stroke Neurologists Dr. Pearlean Brownie or Dr. Roda Shutters at [facility] within the next 4 weeks.   Naomie Dean Neurology 05/31/2017, 1:14 PM

## 2017-06-01 ENCOUNTER — Encounter
Admission: EM | Disposition: A | Discharge status: Home / Self Care | Payer: Self-pay | Source: Home / Self Care | Attending: Internal Medicine

## 2017-06-01 ENCOUNTER — Encounter: Payer: Self-pay | Admitting: Internal Medicine

## 2017-06-01 ENCOUNTER — Inpatient Hospital Stay
Admit: 2017-06-01 | Discharge: 2017-06-01 | Disposition: A | Discharge status: Home / Self Care | Payer: BC Managed Care – PPO | Attending: Internal Medicine | Admitting: Internal Medicine

## 2017-06-01 HISTORY — PX: TEE WITHOUT CARDIOVERSION: SHX5443

## 2017-06-01 SURGERY — ECHOCARDIOGRAM, TRANSESOPHAGEAL
Anesthesia: Moderate Sedation

## 2017-06-01 SURGERY — Surgical Case
Anesthesia: *Unknown

## 2017-06-01 MED ORDER — ATORVASTATIN CALCIUM 40 MG PO TABS: 40.0000 mg | ORAL_TABLET | Freq: Every day | ORAL | 0 refills | Status: AC

## 2017-06-01 MED ORDER — ASPIRIN 81 MG PO TABS: 81.0000 mg | ORAL_TABLET | Freq: Every day | ORAL | 0 refills | Status: AC

## 2017-06-01 MED ORDER — FENTANYL CITRATE (PF) 100 MCG/2ML IJ SOLN
INTRAMUSCULAR | Status: AC
  Filled 2017-06-01: qty 4

## 2017-06-01 MED ORDER — MIDAZOLAM HCL 2 MG/2ML IJ SOLN
INTRAMUSCULAR | Status: AC | PRN
  Administered 2017-06-01 (×3): 1 mg via INTRAVENOUS

## 2017-06-01 MED ORDER — LIDOCAINE VISCOUS 2 % MT SOLN
OROMUCOSAL | Status: AC
  Filled 2017-06-01: qty 15

## 2017-06-01 MED ORDER — MIDAZOLAM HCL 5 MG/5ML IJ SOLN
INTRAMUSCULAR | Status: AC
  Filled 2017-06-01: qty 5

## 2017-06-01 MED ORDER — BUTAMBEN-TETRACAINE-BENZOCAINE 2-2-14 % EX AERO
INHALATION_SPRAY | CUTANEOUS | Status: AC
  Filled 2017-06-01: qty 20

## 2017-06-01 MED ORDER — FENTANYL CITRATE (PF) 100 MCG/2ML IJ SOLN
INTRAMUSCULAR | Status: AC | PRN
  Administered 2017-06-01 (×3): 25 ug via INTRAVENOUS

## 2017-06-01 MED ORDER — SODIUM CHLORIDE 0.9 % IV SOLN: INTRAVENOUS | Status: DC

## 2017-06-01 NOTE — Progress Notes (Signed)
[facility] Encounter Note  Patient: ANGLE DIRUSSO / Admit Date: 05/30/2017 / Date of Encounter: 06/01/2017, 10:26 AM   Subjective: Patient recovering well from stroke. No evidence of significant deficits. Cardiac transesophageal   echocardiogram showing normal LV systolic function with normal heart valves and normal chamber size with patent foramen ovale and bidirectional flow with atrial septal aneurysm likely causing her episode of cryptogenic stroke  Review of Systems: Positive for: None Negative for: Vision change, hearing change, syncope, dizziness, nausea, vomiting,diarrhea, bloody stool, stomach pain, cough, congestion, diaphoresis, urinary frequency, urinary pain,skin lesions, skin rashes Others previously listed  Objective: Telemetry: Normal sinus rhythm Physical Exam: Blood pressure 104/71, pulse 74, temperature (!) 100.8 F (38.2 C), temperature source Oral, resp. rate 20, height 5\' 8"  (1.727 m), weight 88.5 kg (195 lb), last menstrual period 03/28/2015, SpO2 100 %. Body mass index is 29.65 kg/m. General: Well developed, well nourished, in no acute distress. Head: Normocephalic, atraumatic, sclera non-icteric, no xanthomas, nares are without discharge. Neck: No apparent masses Lungs: Normal respirations with no wheezes, no rhonchi, no rales , no crackles   Heart: Regular rate and rhythm, normal S1 S2, no murmur, no rub, no gallop, PMI is normal size and placement, carotid upstroke normal without bruit, jugular venous pressure normal Abdomen: Soft, non-tender, non-distended with normoactive bowel sounds. No hepatosplenomegaly. Abdominal aorta is normal size without bruit Extremities: No edema, no clubbing, no cyanosis, no ulcers,  Peripheral: 2+ radial, 2+ femoral, 2+ dorsal pedal pulses Neuro: Alert and oriented. Moves all extremities spontaneously. Psych:  Responds to questions appropriately with a normal affect.   Intake/Output Summary (Last 24  hours) at 06/01/17 1026 Last data filed at 05/31/17 1800  Gross per 24 hour  Intake              480 ml  Output                0 ml  Net              480 ml    Inpatient Medications:  . aspirin  325 mg Oral Daily  . atorvastatin  40 mg Oral q1800  . butamben-tetracaine-benzocaine      . enoxaparin (LOVENOX) injection  40 mg Subcutaneous Q24H  . fentaNYL      . gabapentin  300 mg Oral TID  . lidocaine      . midazolam       Infusions:   Labs:  Recent Labs  05/30/17 0821  NA 137  K 3.3*  CL 107  CO2 24  GLUCOSE 99  BUN 15  CREATININE 0.83  CALCIUM 9.0   No results for input(s): AST, ALT, ALKPHOS, BILITOT, PROT, ALBUMIN in the last 72 hours.  Recent Labs  05/30/17 0821  WBC 4.6  HGB 12.6  HCT 36.7  MCV 92.3  PLT 206   No results for input(s): CKTOTAL, CKMB, TROPONINI in the last 72 hours. Invalid input(s): POCBNP No results for input(s): HGBA1C in the last 72 hours.   Weights: Filed Weights   05/30/17 0803 05/31/17 0203 06/01/17 0733  Weight: 88.5 kg (195 lb) 88.5 kg (195 lb) 88.5 kg (195 lb)     Radiology/Studies:  Mr Brain Wo Contrast  Result Date: 05/30/2017 CLINICAL DATA:  Numbness in LEFT arm and face, last seen normal earlier today. No motor involvement. Concern for cerebral infarction. EXAM: MRI HEAD WITHOUT CONTRAST MRI CERVICAL SPINE WITHOUT CONTRAST TECHNIQUE: Multiplanar, multiecho pulse sequences of the brain and surrounding  structures, and cervical spine, to include the craniocervical junction and cervicothoracic junction, were obtained without intravenous contrast. COMPARISON:  None. FINDINGS: MRI HEAD FINDINGS Brain: There is a small focus of restricted diffusion, RIGHT lateral thalamus, seen on both axial and coronal diffusion weighted images, corresponding low ADC, consistent with acute infarction. No hemorrhage, mass lesion, hydrocephalus, or extra-axial collection. Normal for age cerebral volume. Small foci of subcortical white matter signal  abnormality, nonspecific, could represent early chronic microvascular ischemic change. Vascular: Normal flow voids. Skull and upper cervical spine: Normal marrow signal. Sinuses/Orbits: Negative. Other: None. MRI CERVICAL SPINE FINDINGS Alignment: Physiologic. Vertebrae: No fracture, evidence of discitis, or bone lesion. Cord: Normal signal and morphology. Posterior Fossa, vertebral arteries, paraspinal tissues: Negative. Disc levels: Annular bulge at C4-5. Hypoplastic C5-C6 disc space.No disc protrusion or spinal stenosis. No neural impingement. IMPRESSION: Small focus of restricted diffusion RIGHT lateral thalamus consistent with acute nonhemorrhagic infarction. This correlates with the reported sensory symptoms. Suspected early chronic microvascular ischemic change in the white matter. Unremarkable cervical spine imaging. No LEFT-sided neural impingement is evident. Electronically Signed   By: Elsie Stain M.D.   On: 05/30/2017 10:45   Mr Cervical Spine Wo Contrast  Result Date: 05/30/2017 CLINICAL DATA:  Numbness in LEFT arm and face, last seen normal earlier today. No motor involvement. Concern for cerebral infarction. EXAM: MRI HEAD WITHOUT CONTRAST MRI CERVICAL SPINE WITHOUT CONTRAST TECHNIQUE: Multiplanar, multiecho pulse sequences of the brain and surrounding structures, and cervical spine, to include the craniocervical junction and cervicothoracic junction, were obtained without intravenous contrast. COMPARISON:  None. FINDINGS: MRI HEAD FINDINGS Brain: There is a small focus of restricted diffusion, RIGHT lateral thalamus, seen on both axial and coronal diffusion weighted images, corresponding low ADC, consistent with acute infarction. No hemorrhage, mass lesion, hydrocephalus, or extra-axial collection. Normal for age cerebral volume. Small foci of subcortical white matter signal abnormality, nonspecific, could represent early chronic microvascular ischemic change. Vascular: Normal flow voids. Skull  and upper cervical spine: Normal marrow signal. Sinuses/Orbits: Negative. Other: None. MRI CERVICAL SPINE FINDINGS Alignment: Physiologic. Vertebrae: No fracture, evidence of discitis, or bone lesion. Cord: Normal signal and morphology. Posterior Fossa, vertebral arteries, paraspinal tissues: Negative. Disc levels: Annular bulge at C4-5. Hypoplastic C5-C6 disc space.No disc protrusion or spinal stenosis. No neural impingement. IMPRESSION: Small focus of restricted diffusion RIGHT lateral thalamus consistent with acute nonhemorrhagic infarction. This correlates with the reported sensory symptoms. Suspected early chronic microvascular ischemic change in the white matter. Unremarkable cervical spine imaging. No LEFT-sided neural impingement is evident. Electronically Signed   By: Elsie Stain M.D.   On: 05/30/2017 10:45   [place] Carotid Bilateral (at [facility] And Ap Only)  Result Date: 05/31/2017 CLINICAL DATA:  Left arm paresthesias EXAM: BILATERAL CAROTID DUPLEX ULTRASOUND TECHNIQUE: Wallace Cullens scale imaging, color Doppler and duplex ultrasound were performed of bilateral carotid and vertebral arteries in the neck. COMPARISON:  05/30/2017 FINDINGS: Criteria: Quantification of carotid stenosis is based on velocity parameters that correlate the residual internal carotid diameter with NASCET-based stenosis levels, using the diameter of the distal internal carotid lumen as the denominator for stenosis measurement. The following velocity measurements were obtained: RIGHT ICA:  117/51 cm/sec CCA:  117/19 cm/sec SYSTOLIC ICA/CCA RATIO:  1.0 DIASTOLIC ICA/CCA RATIO:  2.7 ECA:  80 cm/sec LEFT ICA:  116/44 cm/sec CCA:  117/29 cm/sec SYSTOLIC ICA/CCA RATIO:  1.0 DIASTOLIC ICA/CCA RATIO:  1.5 ECA:  93 cm/sec RIGHT CAROTID ARTERY: No significant carotid atherosclerosis. No hemodynamically significant right ICA stenosis, velocity elevation,  or turbulent flow. Degree of narrowing less than 50%. RIGHT VERTEBRAL ARTERY:  Antegrade LEFT  CAROTID ARTERY: No significant carotid atherosclerosis. No hemodynamically significant left ICA stenosis, velocity elevation, or turbulent flow. LEFT VERTEBRAL ARTERY:  Antegrade IMPRESSION: No significant carotid atherosclerosis or ICA stenosis. Degree of narrowing is less than 50% bilaterally by ultrasound criteria. Patent antegrade vertebral flow bilaterally Electronically Signed   By: Judie PetitM.  Shick M.D.   On: 05/31/2017 08:06   Mr Maxine GlennMra Head/brain ZOWo Cm  Result Date: 05/30/2017 CLINICAL DATA:  Acute RIGHT thalamus infarct. EXAM: MRA HEAD WITHOUT CONTRAST TECHNIQUE: Angiographic images of the Circle of Willis were obtained using MRA technique without intravenous contrast. COMPARISON:  MRI brain earlier today. FINDINGS: The internal carotid arteries are widely patent. The basilar artery is widely patent with vertebrals codominant. There is no visible intracranial stenosis or saccular aneurysm. IMPRESSION: Negative MRA intracranial circulation. Electronically Signed   By: Elsie StainJohn T Curnes M.D.   On: 05/30/2017 15:30     Assessment and Recommendation  46 y.o. 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 Aspirin for further risk reduction cardiovascular event with above 2. Okay for discharge to home 3. Outpatient in the sedation and treatment options with the atrial septal closure device in the future  Signed, Arnoldo HookerBruce Amantha Sklar M.D. FACC

## 2017-06-01 NOTE — Progress Notes (Signed)
OT Screen Note  Patient Details Name: Kristina [name] MRN: [id number] DOB: [birth year]   Cancelled Treatment:    Reason Eval/Treat Not Completed: OT screened, no needs identified, will sign off. Order received, chart reviewed. Met with patient, family members in room. Pt endorses some tingling in L face/eye/neck/LUE but improving significantly since initially presenting to [facility]. No strength/vision/cognitive deficits notes. Pt indep with functional mobility and ADL tasks with no concerns or questions at this time. No skilled acute OT needs at this time. Will sign off. Please re-consult if additional needs arise.   Jeni Salles, MPH, MS, OTR/L ascom [phone] 06/01/17, 10:14 AM

## 2017-06-01 NOTE — Progress Notes (Signed)
SLP Cancellation Note  Patient Details Name: Kristina [name] MRN: [id number] DOB: [birth year]   Cancelled treatment:       Reason Eval/Treat Not Completed: SLP screened, no needs identified, will sign off (chart reviewed; consulted NSG then met w/ pt/family) Pt conversed at conversational level w/out deficits noted; pt and family denied any speech-language deficits. Pt denied any swallowing deficits; currently on a regular diet.   No further skilled ST services indicated as pt appears at her baseline. Pt agreed. NSG to reconsult if any change in status.    Orinda Kenner, [place], CCC-SLP Leomar Westberg 06/01/2017, 12:32 PM

## 2017-06-01 NOTE — Consult Note (Signed)
[facility] Cardiology Consultation Note  [name], MRN: [id number], DOB/AGE: November 18, 1970 45 y.o. Admit date: 05/30/2017   Date of Consult: 06/01/2017 Primary Physician: Patient, No Pcp Per Primary Cardiologist:  none  Chief Complaint:  Chief Complaint  Patient presents with  . Numbness   Reason for Consult: stroke  HPI: 46 y.o. female with the known stroke improving at this time on antiplatelet medication management with an echocardiogram showing normal LV systolic function and bidirectional shunting and no current evidence of rhythm disturbances including atrial fibrillation and no current inflammatory issues or antiphospholipid antibody issues. Transesophageal echocardiogram pending for further evaluation of shunting and treatment options  History reviewed. No pertinent past medical history.    Surgical History:  Past Surgical History:  Procedure Laterality Date  . ABDOMINAL HYSTERECTOMY     04/13/15  . CHOLECYSTECTOMY    . FRACTURE SURGERY Right    ankle fusion  . LEEP    . SALPINGOOPHORECTOMY Bilateral 04/13/2015   Procedure: SALPINGO OOPHORECTOMY;  Surgeon: Suzy Bouchard, MD;  Location: [facility] ORS;  Service: Gynecology;  Laterality: Bilateral;  . TONSILLECTOMY    . VAGINAL HYSTERECTOMY N/A 04/13/2015   Procedure: HYSTERECTOMY VAGINAL;  Surgeon: Suzy Bouchard, MD;  Location: [facility] ORS;  Service: Gynecology;  Laterality: N/A;     Home Meds: Prior to Admission medications   Medication Sig Start Date End Date Taking? Authorizing Provider  oxyCODONE-acetaminophen (PERCOCET/ROXICET) 5-325 MG per tablet Take 2 tablets by mouth every 4 (four) hours as needed for moderate pain. 04/14/15  Yes Schermerhorn, Ihor Austin, MD  docusate sodium (COLACE) 50 MG capsule Take 1 capsule (50 mg total) by mouth 2 (two) times daily. Patient not taking: Reported on 05/30/2017 04/14/15   Schermerhorn, Ihor Austin, MD  naproxen (NAPROSYN) 500 MG tablet Take 1 tablet (500 mg  total) by mouth 2 (two) times daily with a meal. Patient not taking: Reported on 05/30/2017 04/14/15   Schermerhorn, Ihor Austin, MD    Inpatient Medications:  . [MAR Hold] aspirin  325 mg Oral Daily  . [MAR Hold] atorvastatin  40 mg Oral q1800  . butamben-tetracaine-benzocaine      . [MAR Hold] enoxaparin (LOVENOX) injection  40 mg Subcutaneous Q24H  . fentaNYL      . [MAR Hold] gabapentin  300 mg Oral TID  . lidocaine      . midazolam       . sodium chloride      Allergies: No Known Allergies  Social History   Social History  . Marital status: Divorced    Spouse name: N/A  . Number of children: N/A  . Years of education: N/A   Occupational History  . Not on file.   Social History Main Topics  . Smoking status: Never Smoker  . Smokeless tobacco: Never Used  . Alcohol use No  . Drug use: No  . Sexual activity: Not Currently   Other Topics Concern  . Not on file   Social History Narrative  . No narrative on file     Family History  Problem Relation Age of Onset  . Breast cancer Maternal Uncle 30     Review of Systems Positive for Stroke Negative for: General:  chills, fever, night sweats or weight changes.  Cardiovascular: PND orthopnea syncope dizziness  Dermatological skin lesions rashes Respiratory: Cough congestion Urologic: Frequent urination urination at night and hematuria Abdominal: negative for nausea, vomiting, diarrhea, bright red blood per rectum, melena, or hematemesis Neurologic: negative for  visual changes, and/or hearing changes  All other systems reviewed and are otherwise negative except as noted above.  Labs: No results for input(s): CKTOTAL, CKMB, TROPONINI in the last 72 hours. Lab Results  Component Value Date   WBC 4.6 05/30/2017   HGB 12.6 05/30/2017   HCT 36.7 05/30/2017   MCV 92.3 05/30/2017   PLT 206 05/30/2017    Recent Labs Lab 05/30/17 0821  NA 137  K 3.3*  CL 107  CO2 24  BUN 15  CREATININE 0.83  CALCIUM 9.0   GLUCOSE 99   Lab Results  Component Value Date   CHOL 136 05/31/2017   HDL 41 05/31/2017   LDLCALC 76 05/31/2017   TRIG 93 05/31/2017   No results found for: DDIMER  Radiology/Studies:  Mr Brain Wo Contrast  Result Date: 05/30/2017 CLINICAL DATA:  Numbness in LEFT arm and face, last seen normal earlier today. No motor involvement. Concern for cerebral infarction. EXAM: MRI HEAD WITHOUT CONTRAST MRI CERVICAL SPINE WITHOUT CONTRAST TECHNIQUE: Multiplanar, multiecho pulse sequences of the brain and surrounding structures, and cervical spine, to include the craniocervical junction and cervicothoracic junction, were obtained without intravenous contrast. COMPARISON:  None. FINDINGS: MRI HEAD FINDINGS Brain: There is a small focus of restricted diffusion, RIGHT lateral thalamus, seen on both axial and coronal diffusion weighted images, corresponding low ADC, consistent with acute infarction. No hemorrhage, mass lesion, hydrocephalus, or extra-axial collection. Normal for age cerebral volume. Small foci of subcortical white matter signal abnormality, nonspecific, could represent early chronic microvascular ischemic change. Vascular: Normal flow voids. Skull and upper cervical spine: Normal marrow signal. Sinuses/Orbits: Negative. Other: None. MRI CERVICAL SPINE FINDINGS Alignment: Physiologic. Vertebrae: No fracture, evidence of discitis, or bone lesion. Cord: Normal signal and morphology. Posterior Fossa, vertebral arteries, paraspinal tissues: Negative. Disc levels: Annular bulge at C4-5. Hypoplastic C5-C6 disc space.No disc protrusion or spinal stenosis. No neural impingement. IMPRESSION: Small focus of restricted diffusion RIGHT lateral thalamus consistent with acute nonhemorrhagic infarction. This correlates with the reported sensory symptoms. Suspected early chronic microvascular ischemic change in the white matter. Unremarkable cervical spine imaging. No LEFT-sided neural impingement is evident.  Electronically Signed   By: Elsie Stain M.D.   On: 05/30/2017 10:45   Mr Cervical Spine Wo Contrast  Result Date: 05/30/2017 CLINICAL DATA:  Numbness in LEFT arm and face, last seen normal earlier today. No motor involvement. Concern for cerebral infarction. EXAM: MRI HEAD WITHOUT CONTRAST MRI CERVICAL SPINE WITHOUT CONTRAST TECHNIQUE: Multiplanar, multiecho pulse sequences of the brain and surrounding structures, and cervical spine, to include the craniocervical junction and cervicothoracic junction, were obtained without intravenous contrast. COMPARISON:  None. FINDINGS: MRI HEAD FINDINGS Brain: There is a small focus of restricted diffusion, RIGHT lateral thalamus, seen on both axial and coronal diffusion weighted images, corresponding low ADC, consistent with acute infarction. No hemorrhage, mass lesion, hydrocephalus, or extra-axial collection. Normal for age cerebral volume. Small foci of subcortical white matter signal abnormality, nonspecific, could represent early chronic microvascular ischemic change. Vascular: Normal flow voids. Skull and upper cervical spine: Normal marrow signal. Sinuses/Orbits: Negative. Other: None. MRI CERVICAL SPINE FINDINGS Alignment: Physiologic. Vertebrae: No fracture, evidence of discitis, or bone lesion. Cord: Normal signal and morphology. Posterior Fossa, vertebral arteries, paraspinal tissues: Negative. Disc levels: Annular bulge at C4-5. Hypoplastic C5-C6 disc space.No disc protrusion or spinal stenosis. No neural impingement. IMPRESSION: Small focus of restricted diffusion RIGHT lateral thalamus consistent with acute nonhemorrhagic infarction. This correlates with the reported sensory symptoms. Suspected early chronic microvascular  ischemic change in the white matter. Unremarkable cervical spine imaging. No LEFT-sided neural impingement is evident. Electronically Signed   By: Elsie Stain M.D.   On: 05/30/2017 10:45   [place] Carotid Bilateral (at [facility]  Only)  Result Date: 05/31/2017 CLINICAL DATA:  Left arm paresthesias EXAM: BILATERAL CAROTID DUPLEX ULTRASOUND TECHNIQUE: Wallace Cullens scale imaging, color Doppler and duplex ultrasound were performed of bilateral carotid and vertebral arteries in the neck. COMPARISON:  05/30/2017 FINDINGS: Criteria: Quantification of carotid stenosis is based on velocity parameters that correlate the residual internal carotid diameter with NASCET-based stenosis levels, using the diameter of the distal internal carotid lumen as the denominator for stenosis measurement. The following velocity measurements were obtained: RIGHT ICA:  117/51 cm/sec CCA:  117/19 cm/sec SYSTOLIC ICA/CCA RATIO:  1.0 DIASTOLIC ICA/CCA RATIO:  2.7 ECA:  80 cm/sec LEFT ICA:  116/44 cm/sec CCA:  117/29 cm/sec SYSTOLIC ICA/CCA RATIO:  1.0 DIASTOLIC ICA/CCA RATIO:  1.5 ECA:  93 cm/sec RIGHT CAROTID ARTERY: No significant carotid atherosclerosis. No hemodynamically significant right ICA stenosis, velocity elevation, or turbulent flow. Degree of narrowing less than 50%. RIGHT VERTEBRAL ARTERY:  Antegrade LEFT CAROTID ARTERY: No significant carotid atherosclerosis. No hemodynamically significant left ICA stenosis, velocity elevation, or turbulent flow. LEFT VERTEBRAL ARTERY:  Antegrade IMPRESSION: No significant carotid atherosclerosis or ICA stenosis. Degree of narrowing is less than 50% bilaterally by ultrasound criteria. Patent antegrade vertebral flow bilaterally Electronically Signed   By: Judie Petit.  Shick M.D.   On: 05/31/2017 08:06   Mr Maxine Glenn Head/brain ZO Cm  Result Date: 05/30/2017 CLINICAL DATA:  Acute RIGHT thalamus infarct. EXAM: MRA HEAD WITHOUT CONTRAST TECHNIQUE: Angiographic images of the Circle of Willis were obtained using MRA technique without intravenous contrast. COMPARISON:  MRI brain earlier today. FINDINGS: The internal carotid arteries are widely patent. The basilar artery is widely patent with vertebrals codominant. There is no visible intracranial  stenosis or saccular aneurysm. IMPRESSION: Negative MRA intracranial circulation. Electronically Signed   By: Elsie Stain M.D.   On: 05/30/2017 15:30    EKG: Normal sinus rhythm  Weights: Filed Weights   05/30/17 0803 05/31/17 0203 06/01/17 0733  Weight: 88.5 kg (195 lb) 88.5 kg (195 lb) 88.5 kg (195 lb)     Physical Exam: Blood pressure 110/73, pulse 77, temperature (!) 100.8 F (38.2 C), temperature source Oral, resp. rate 20, height 5\' 8"  (1.727 m), weight 88.5 kg (195 lb), last menstrual period 03/28/2015, SpO2 98 %. Body mass index is 29.65 kg/m. General: Well developed, well nourished, in no acute distress. Head eyes ears nose throat: Normocephalic, atraumatic, sclera non-icteric, no xanthomas, nares are without discharge. No apparent thyromegaly and/or mass  Lungs: Normal respiratory effort.  no wheezes, no rales, no rhonchi.  Heart: RRR with normal S1 S2. no murmur gallop, no rub, PMI is normal size and placement, carotid upstroke normal without bruit, jugular venous pressure is normal Abdomen: Soft, non-tender, non-distended with normoactive bowel sounds. No hepatomegaly. No rebound/guarding. No obvious abdominal masses. Abdominal aorta is normal size without bruit Extremities: No edema. no cyanosis, no clubbing, no ulcers  Peripheral : 2+ bilateral upper extremity pulses, 2+ bilateral femoral pulses, 2+ bilateral dorsal pedal pulse Neuro: Alert and oriented. No facial asymmetry. No focal deficit. Moves all extremities spontaneously. Musculoskeletal: Normal muscle tone without kyphosis Psych:  Responds to questions appropriately with a normal affect.    Assessment: 46 year old female with no cardiovascular disease risk factors having a stroke with probable small atrial septal defect patent foramen ovale  with bidirectional flow needing further treatment options  Plan: 1. Transesophageal echocardiogram for further evaluation of bidirectional atrial septal defect and/or patent  foramen ovale 2. Likely primary cause of the symptoms and stroke as above and would consider anticoagulation for this issue 3. Continue supportive care for recovery  Signed, Lamar BlinksBruce J Kowalski M.D. [facility] 06/01/2017, 8:16 AM

## 2017-06-01 NOTE — Care Management (Signed)
CM screen due to diagnosis of cva.  Patient without deficits.  TEE in progress.  At present, attending has not identified any discharge needs. Physical therapy did not have any recommendations.  Patient does have PCP- but is a gyn/ob as she has never been sick.  Provided patient with list of medical physicians accepting new patients .  She verbalizes she has not concerns

## 2017-06-01 NOTE — Discharge Summary (Signed)
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 No pertinent past medical history.  HOSPITAL COURSE:   1. Acute stroke involving the right thalamus. Patient with left arm sensory disturbance. Case discussed with neurology. Continue aspirin full dose. Increase atorvastatin to high intensity dose. Hypercoagulable workup sent off.  -TTE with bubble study negative . Pt underwent TEE and showed PFO with Atrial septal aneurysm. Per Dr Gwen Pounds cont asa and f/w out pt for closure.  2. Relative hypotension. Patient states blood pressures always on the low side.  Doing well. D/c home  CONSULTS OBTAINED:  Treatment Team:  Anson Fret, MD Lamar Blinks, MD  DRUG ALLERGIES:  No Known Allergies  DISCHARGE MEDICATIONS:   Current Discharge Medication List    START taking these medications   Details  aspirin 81 MG tablet Take 1 tablet (81 mg total) by mouth daily. Qty: 30 tablet, Refills: 0    atorvastatin (LIPITOR) 40 MG tablet Take 1 tablet (40 mg total) by mouth daily at 6 PM. Qty: 30 tablet, Refills: 0      CONTINUE these medications which have NOT CHANGED   Details  oxyCODONE-acetaminophen (PERCOCET/ROXICET) 5-325 MG per tablet Take 2 tablets by mouth every 4 (four) hours as needed for moderate pain. Qty: 30 tablet, Refills: 0    docusate sodium (COLACE) 50 MG capsule Take 1 capsule (50 mg total) by mouth 2 (two) times daily. Qty: 60 capsule, Refills: 0    naproxen  (NAPROSYN) 500 MG tablet Take 1 tablet (500 mg total) by mouth 2 (two) times daily with a meal. Qty: 60 tablet, Refills: 1        If you experience worsening of your admission symptoms, develop shortness of breath, life threatening emergency, suicidal or homicidal thoughts you must seek medical attention immediately by calling 911 or calling your MD immediately  if symptoms less severe.  You Must read complete instructions/literature along with all the possible adverse reactions/side effects for all the Medicines you take and that have been prescribed to you. Take any new Medicines after you have completely understood and accept all the possible adverse reactions/side effects.   Please note  You were cared for by a hospitalist during your hospital stay. If you have any questions about your discharge medications or the care you received while you were in the hospital after you are discharged, you can call the unit and asked to speak with the hospitalist on call if the hospitalist that took care of you is not available. Once you are discharged, your primary care physician will handle any further medical issues. Please note that NO REFILLS for any discharge medications will be authorized once you are discharged, as it is imperative that you return to your primary care physician (or establish a relationship with a primary care physician if you do not have one) for your aftercare needs so that they can reassess your need for medications and monitor your lab values. Today   SUBJECTIVE   Doing ok  VITAL SIGNS:  Blood pressure 104/71, pulse 74, temperature (!) 100.8 F (38.2 C), temperature source Oral, resp. rate 20, height 5\' 8"  (1.727 m), weight 88.5 kg (195 lb), last menstrual period 03/28/2015, SpO2 100 %.  I/O:   Intake/Output Summary (Last 24 hours) at 06/01/17 1119 Last data filed at 05/31/17 1800  Gross per 24 hour  Intake              480 ml  Output                0 ml  Net               480 ml    PHYSICAL EXAMINATION:  GENERAL:  46 y.o.-year-old patient lying in the bed with no acute distress.  EYES: Pupils equal, round, reactive to light and accommodation. No scleral icterus. Extraocular muscles intact.  HEENT: Head atraumatic, normocephalic. Oropharynx and nasopharynx clear.  NECK:  Supple, no jugular venous distention. No thyroid enlargement, no tenderness.  LUNGS: Normal breath sounds bilaterally, no wheezing, rales,rhonchi or crepitation. No use of accessory muscles of respiration.  CARDIOVASCULAR: S1, S2 normal. No murmurs, rubs, or gallops.  ABDOMEN: Soft, non-tender, non-distended. Bowel sounds present. No organomegaly or mass.  EXTREMITIES: No pedal edema, cyanosis, or clubbing.  NEUROLOGIC: Cranial nerves II through XII are intact. Muscle strength 5/5 in all extremities. Sensation intact. Gait not checked.  PSYCHIATRIC: The patient is alert and oriented x 3.  SKIN: No obvious rash, lesion, or ulcer.   DATA REVIEW:   CBC   Recent Labs Lab 05/30/17 0821  WBC 4.6  HGB 12.6  HCT 36.7  PLT 206    Chemistries   Recent Labs Lab 05/30/17 0821  NA 137  K 3.3*  CL 107  CO2 24  GLUCOSE 99  BUN 15  CREATININE 0.83  CALCIUM 9.0    Microbiology Results   No results found for this or any previous visit (from the past 240 hour(s)).  RADIOLOGY:  [place] Carotid Bilateral (at [facility] Only)  Result Date: 05/31/2017 CLINICAL DATA:  Left arm paresthesias EXAM: BILATERAL CAROTID DUPLEX ULTRASOUND TECHNIQUE: Wallace CullensGray scale imaging, color Doppler and duplex ultrasound were performed of bilateral carotid and vertebral arteries in the neck. COMPARISON:  05/30/2017 FINDINGS: Criteria: Quantification of carotid stenosis is based on velocity parameters that correlate the residual internal carotid diameter with NASCET-based stenosis levels, using the diameter of the distal internal carotid lumen as the denominator for stenosis measurement. The following velocity  measurements were obtained: RIGHT ICA:  117/51 cm/sec CCA:  117/19 cm/sec SYSTOLIC ICA/CCA RATIO:  1.0 DIASTOLIC ICA/CCA RATIO:  2.7 ECA:  80 cm/sec LEFT ICA:  116/44 cm/sec CCA:  117/29 cm/sec SYSTOLIC ICA/CCA RATIO:  1.0 DIASTOLIC ICA/CCA RATIO:  1.5 ECA:  93 cm/sec RIGHT CAROTID ARTERY: No significant carotid atherosclerosis. No hemodynamically significant right ICA stenosis, velocity elevation, or turbulent flow. Degree of narrowing less than 50%. RIGHT VERTEBRAL ARTERY:  Antegrade LEFT CAROTID ARTERY: No significant carotid atherosclerosis. No hemodynamically significant left ICA stenosis, velocity elevation, or turbulent flow. LEFT VERTEBRAL ARTERY:  Antegrade IMPRESSION: No significant carotid atherosclerosis or ICA stenosis. Degree of narrowing is less than 50% bilaterally by ultrasound criteria. Patent antegrade vertebral flow bilaterally Electronically Signed   By: Judie PetitM.  Shick M.D.   On: 05/31/2017 08:06   Mr Maxine GlennMra Head/brain ZOWo Cm  Result Date: 05/30/2017 CLINICAL DATA:  Acute RIGHT thalamus infarct. EXAM: MRA HEAD WITHOUT CONTRAST TECHNIQUE: Angiographic images of the Circle of Willis were  obtained using MRA technique without intravenous contrast. COMPARISON:  MRI brain earlier today. FINDINGS: The internal carotid arteries are widely patent. The basilar artery is widely patent with vertebrals codominant. There is no visible intracranial stenosis or saccular aneurysm. IMPRESSION: Negative MRA intracranial circulation. Electronically Signed   By: Elsie Stain M.D.   On: 05/30/2017 15:30     Management plans discussed with the patient, family and they are in agreement.  CODE STATUS:     Code Status Orders        Start     Ordered   05/30/17 1327  Full code  Continuous     05/30/17 1326    Code Status History    Date Active Date Inactive Code Status Order ID Comments User Context   04/13/2015 10:24 AM 04/14/2015  6:38 PM Full Code [id number]  Schermerhorn, Ihor Austin, MD Inpatient       TOTAL TIME TAKING CARE OF THIS PATIENT: 40 minutes.    Maricel Swartzendruber M.D on 06/01/2017 at 11:19 AM  Between 7am to 6pm - Pager - [phone] After 6pm go to [email or web address] - password [email or web address]  [facility]  Office  [phone]  CC: Primary care physician; Patient, No Pcp Per

## 2017-06-01 NOTE — Progress Notes (Signed)
*  PRELIMINARY RESULTS* Echocardiogram Echocardiogram Transesophageal has been performed.  Cristela BlueHege, Murdis Flitton 06/01/2017, 9:15 AM

## 2017-06-01 NOTE — Progress Notes (Signed)
Patient clinically stable post procedure, awake/alert and oriented. Vitals stable. Family members at bedside. [facility] with questions answered,

## 2017-06-01 NOTE — Progress Notes (Signed)
Pt is being discharged today, instructions reviewed with the patient and her husband, both verified understanding. She was not given any paper prescriptions. All belongings packed and returned to her. IV X1 removed. She was rolled out in a wheelchair by staff.

## 2017-06-02 LAB — HEMOGLOBIN A1C
HEMOGLOBIN A1C: 5.4 % (ref 4.8–5.6)
MEAN PLASMA GLUCOSE: 108 mg/dL

## 2017-06-02 LAB — HIV ANTIBODY (ROUTINE TESTING W REFLEX): HIV Screen 4th Generation wRfx: NONREACTIVE

## 2017-06-04 ENCOUNTER — Telehealth: Payer: Self-pay | Admitting: Neurology

## 2017-06-04 NOTE — Telephone Encounter (Signed)
Patient called office in reference to having stroke over the weekend and had burning/numbness this morning.  The burning has gone away but numbness is lightly there and patient said this is how symptoms started over the weekend.  Also patient has had a headache since Tuesday (taking tylenol).  Patient wants to make sure she isn't over reacting, but wants to be sure everything is okay.  Please call

## 2017-06-05 NOTE — Telephone Encounter (Signed)
Patient never saw Dr. Roda ShuttersXu or Dr.Sethi in th hospital. Pt was seen by Dr. Lucia GaskinsAhern in hospital.

## 2017-06-05 NOTE — Telephone Encounter (Signed)
The patient and told her that her fluctuating numbness is likely related to her recent thalamic lacunar infarct and expect improvement over time. Numbness is not bothersome enough at the present time to justify medications. Advise her to continue to take Tylenol Extra Strength 2 tablets max3 times dailyy if headache does not improve in the next few days she was advised to call back. She also asked to call back if he had new neurological symptoms or worsening headache. She voiced understanding.

## 2017-06-08 NOTE — Telephone Encounter (Signed)
Dr.Ahern wants pt to be seen sooner. Dr. Lucia GaskinsAhern spoke with patient today and she will see Dr .Roda ShuttersXu at 0900am. Pt will check in at 0830. Dr. Roda ShuttersXu is aware. [facility].

## 2017-06-08 NOTE — Telephone Encounter (Signed)
Sure, will see her tomorrow.  Marvel PlanJindong Adrienna Karis, MD PhD Stroke Neurology 06/08/2017 12:57 PM

## 2017-06-08 NOTE — Telephone Encounter (Signed)
Dr Roda ShuttersXu, I have set patient up with you tomorrow. [facility] Young stroke patient. Thanks.

## 2017-06-09 ENCOUNTER — Ambulatory Visit (INDEPENDENT_AMBULATORY_CARE_PROVIDER_SITE_OTHER): Payer: BC Managed Care – PPO | Admitting: Neurology

## 2017-06-09 ENCOUNTER — Other Ambulatory Visit: Payer: Self-pay | Admitting: Obstetrics and Gynecology

## 2017-06-09 ENCOUNTER — Encounter: Payer: Self-pay | Admitting: Neurology

## 2017-06-09 VITALS — BP 99/73 | HR 85 | Ht 68.0 in | Wt 197.0 lb

## 2017-06-09 DIAGNOSIS — R200 Anesthesia of skin: Secondary | ICD-10-CM | POA: Diagnosis not present

## 2017-06-09 DIAGNOSIS — G4489 Other headache syndrome: Secondary | ICD-10-CM

## 2017-06-09 DIAGNOSIS — Z1231 Encounter for screening mammogram for malignant neoplasm of breast: Secondary | ICD-10-CM

## 2017-06-09 NOTE — Progress Notes (Signed)
NEUROLOGY CLINIC NEW PATIENT NOTE  NAME: Kristina [name] DOB: 1971/11/12 REFERRING PHYSICIAN: No ref. provider found  I saw Kristina [name] as a new consult in the neurovascular clinic today regarding  Chief Complaint  Patient presents with  . New Patient (Initial Visit)    Referral from [facility] for July ischemic stroke, pt saw Dr.Ahern at [facility], Pt never saw Dr.Keyanna Sandefer  .  HPI: Kristina [name] is a 46 y.o. female with PMH of hysterectomy who presents as a new patient for a stroke.   Patient woke up Saturday 05/30/17 6 AM in the morning felt to have intensive left arm numbness and burning sensation. She also felt left face, left eye, left a stroke numbness. She denies any weakness, speech difficulty, or vision changes. She went to [facility] ED and had MRI brain at 9:30 AM showed right thalamic small infarct. However, by my review, I'm not convinced there is any significant change of DWI at right thalamic region, and I'm not convinced that it should be called as a stroke. She had a extensive stroke workup, including MRA, carotid Doppler, and TTE. All of them were negative. LDL 76 and A1C 5.4. Hypercoagulable workup negative so far, but depending antiphospholipid lipid antibodies. TEE showed PFO with ASA. She was discharged with aspirin 81 and Lipitor 40.  Sunday, patient started to have headache, severe, relieved by Tylenol. Tuesday she had excruciating headache on wake up, worst headache ever, and she took Tylenol, headache gradually getting better. For the next couple days she also had several headache episode. She also complains of intermittent left arm and left face numbness tingling with occasional burning sensation on the left arm. However, he denies any previous history of headache.  After discharge, she followed with Dr. Gwen Pounds cardiology, and her aspirin increased to 325 and she is going to have a PFO closure with Dr. Gwen Pounds.   She denies smoking, or illicit drugs. Only  occasional social drinking alcohol. Not taking birth control pills as she had hysterectomy. No sign of sleep apnea. She had 6 children and just newly married.   Past Medical History:  Diagnosis Date  . Stroke [facility])    Past Surgical History:  Procedure Laterality Date  . ABDOMINAL HYSTERECTOMY     04/13/15  . CHOLECYSTECTOMY    . FRACTURE SURGERY Right    ankle fusion  . LEEP    . SALPINGOOPHORECTOMY Bilateral 04/13/2015   Procedure: SALPINGO OOPHORECTOMY;  Surgeon: Suzy Bouchard, MD;  Location: [facility] ORS;  Service: Gynecology;  Laterality: Bilateral;  . TEE WITHOUT CARDIOVERSION N/A 06/01/2017   Procedure: TRANSESOPHAGEAL ECHOCARDIOGRAM (TEE);  Surgeon: Lamar Blinks, MD;  Location: [facility] ORS;  Service: Cardiovascular;  Laterality: N/A;  . TONSILLECTOMY    . VAGINAL HYSTERECTOMY N/A 04/13/2015   Procedure: HYSTERECTOMY VAGINAL;  Surgeon: Suzy Bouchard, MD;  Location: [facility] ORS;  Service: Gynecology;  Laterality: N/A;   Family History  Problem Relation Age of Onset  . Breast cancer Maternal Uncle 30  . Stroke Maternal Grandfather   . Stroke Paternal Grandfather    Current Outpatient Prescriptions  Medication Sig Dispense Refill  . aspirin 81 MG tablet Take 1 tablet (81 mg total) by mouth daily. (Patient taking differently: Take 325 mg by mouth daily. ) 30 tablet 0  . atorvastatin (LIPITOR) 40 MG tablet Take 1 tablet (40 mg total) by mouth daily at 6 PM. 30 tablet 0  . docusate sodium (COLACE) 50 MG capsule Take 1 capsule (50  mg total) by mouth 2 (two) times daily. 60 capsule 0  . oxyCODONE-acetaminophen (PERCOCET/ROXICET) 5-325 MG per tablet Take 2 tablets by mouth every 4 (four) hours as needed for moderate pain. 30 tablet 0  . tiZANidine (ZANAFLEX) 4 MG tablet Take by mouth every 6 (six) hours as needed.      No current facility-administered medications for this visit.    No Known Allergies Social History   Social History  . Marital status: Divorced    Spouse  name: N/A  . Number of children: N/A  . Years of education: N/A   Occupational History  . Not on file.   Social History Main Topics  . Smoking status: Never Smoker  . Smokeless tobacco: Never Used  . Alcohol use No  . Drug use: No  . Sexual activity: Not Currently   Other Topics Concern  . Not on file   Social History Narrative  . No narrative on file    Review of Systems Full 14 system review of systems performed and notable only for those listed, all others are neg:  Constitutional:  Chills, fatigue Cardiovascular:  Chest pain Ear/Nose/Throat:  Ringing in ears, spinning sensation Skin:  Eyes:  Blurry vision Respiratory:  SOB Gastroitestinal:   Genitourinary:  Hematology/Lymphatic:   Endocrine:  Increased thirst Musculoskeletal:   Allergy/Immunology:  Skin sensitivity Neurological:  Headache, dizziness Psychiatric:  Sleep:  Insomnia   Physical Exam  Vitals:   06/09/17 0910  BP: 99/73  Pulse: 85    General - Well nourished, well developed, in no apparent distress.  Ophthalmologic - Sharp disc margins OU.  Cardiovascular - Regular rate and rhythm with no murmur. Carotid pulses were 2+ without bruits .   Neck - supple, no nuchal rigidity .  Mental Status -  Level of arousal and orientation to time, place, and person were intact. Language including expression, naming, repetition, comprehension, reading, and writing was assessed and found intact. Attention span and concentration were normal. Fund of Knowledge was assessed and was intact.  Cranial Nerves II - XII - II - Visual field intact OU. III, IV, VI - Extraocular movements intact. V - Facial sensation intact bilaterally. VII - Facial movement intact bilaterally. VIII - Hearing & vestibular intact bilaterally. X - Palate elevates symmetrically. XI - Chin turning & shoulder shrug intact bilaterally. XII - Tongue protrusion intact.  Motor Strength - The patient's strength was normal in all  extremities and pronator drift was absent.  Bulk was normal and fasciculations were absent.   Motor Tone - Muscle tone was assessed at the neck and appendages and was normal.  Reflexes - The patient's reflexes were normal in all extremities and she had no pathological reflexes.  Sensory - Light touch, temperature/pinprick, vibration and proprioception, and Romberg testing were assessed and were normal.    Coordination - The patient had normal movements in the hands and feet with no ataxia or dysmetria.  Tremor was absent.  Gait and Station - The patient's transfers, posture, gait, station, and turns were observed as normal.   Imaging  I have personally reviewed the radiological images below and agree with the radiology interpretations.  MRI brain and C-spine Small focus of restricted diffusion RIGHT lateral thalamus consistent with acute nonhemorrhagic infarction. This correlates with the reported sensory symptoms. Suspected early chronic microvascular ischemic change in the white matter. Unremarkable cervical spine imaging. No LEFT-sided neural impingement is evident. However, by my review the images, the DWI signal at right thalamic region is  very faint and most consistent with concentrated nerve fibers, not consistent with acute infarct.   MRA head - negative MRA intracranial circulation  CUS - No significant carotid atherosclerosis or ICA stenosis. Degree of narrowing is less than 50% bilaterally by ultrasound criteria. Patent antegrade vertebral flow bilaterally  TTE - - Left ventricle: The cavity size was normal. Systolic function was   normal. The estimated ejection fraction was in the range of 55%   to 60%. - Atrial septum: A septal defect cannot be excluded. There was a   small bidirectional shunt.  TEE - Left ventricle: The cavity size was normal. Wall thickness was   normal. Systolic function was normal. The estimated ejection   fraction was in the range of 55% to  60%. PFO adn atrial eptal   aneurysm present with shunting. - Aortic valve: No evidence of vegetation. There was trivial   regurgitation. - Mitral valve: No evidence of vegetation. - Left atrium: No evidence of thrombus in the atrial cavity or   appendage. - Right atrium: No evidence of thrombus in the atrial cavity or   appendage. - Atrial septum: There was a patent foramen ovale. There was a   bidirectional shunt. 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 - Tricuspid valve: No evidence of vegetation. - Pulmonic valve: No evidence of vegetation.  Lab Review  Component     Latest Ref Rng & Units 05/31/2017  Cholesterol     0 - 200 mg/dL 161  Triglycerides     <150 mg/dL 93  HDL Cholesterol     >40 mg/dL 41  Total CHOL/HDL Ratio     RATIO 3.3  VLDL     0 - 40 mg/dL 19  LDL (calc)     0 - 99 mg/dL 76  PTT Lupus Anticoagulant     0.0 - 51.9 sec 28.8  DRVVT     0.0 - 47.0 sec 26.2  Lupus Anticoag Interp      Comment:  Hemoglobin A1C     4.8 - 5.6 % 5.4  Mean Plasma Glucose     mg/dL 096  HIV Screen 4th Generation wRfx     Non Reactive Non Reactive  Antithrombin Activity     75 - 120 % 96  Protein C-Functional     73 - 180 % 122  Protein C, Total     60 - 150 % 115  Protein S-Functional     63 - 140 % 96  Protein S, Total     60 - 150 % 87  Homocysteine     0.0 - 15.0 umol/L 7.5  Recommendations-F5LEID:      Comment  Recommendations-PTGENE:      Comment     Assessment and Plan:   In summary, Kristina [name] is a 46 y.o. female with PMH of Hysterectomy presented as new pt for "stroke". She had left face and arm numbness and MRI concerning for right thalamic infarct. However by my reviewing of the images, I don't feel that was an acute infarct but more concentrated neuro fiber signal. Stroke work up negative except PFO and ASA. Her symptoms followed with HA and intermittent recurrence of same symptoms. I feel her symptom pattern more  likely consistent with complicated migraine instead of right thalamic infarct. It is well known that PFO occur about 50% of migraine pts. MRI repeat will not help as it will not show anything at this time. However,  pt concerns that she has 6 kids and newly married and she would like to close the PFO to give her more security. In this circumstances, I feel it is reasonable to have PFO closed given the benefit of doubt.    - continue ASA and lipitor for stroke prevention - agree with PFO closure procedure. Please continue to follow up with Dr. Gwen PoundsKowalski for the procedure.  - check BP at home  - keep headache dairy - healthy diet and regular exercise - follow up in 3 months.   Thank you very much for the opportunity to participate in the care of this patient.  Please do not hesitate to call if any questions or concerns arise.  No orders of the defined types were placed in this encounter.   Meds ordered this encounter  Medications  . DISCONTD: oxyCODONE (OXY IR/ROXICODONE) 5 MG immediate release tablet    Sig: Take by mouth.  . DISCONTD: RA ASPIRIN EC 81 MG EC tablet    Sig: Take 81 mg by mouth daily.    Refill:  0  . DISCONTD: naproxen (NAPROSYN) 500 MG tablet    Sig: Take by mouth.  Marland Kitchen. tiZANidine (ZANAFLEX) 4 MG tablet    Sig: Take by mouth every 6 (six) hours as needed.     Patient Instructions  - continue ASA and lipitor for stroke prevention - agree with PFO closure procedure. Please follow up with Dr. Avelino LeedsKolwalski for the procedure.  - check BP at home  - keep headache dairy - healthy diet and regular exercise - follow up in 3 months.    Marvel PlanJindong Exavier Lina, MD PhD [facility] [address], [address] [place], [place] [address] [phone]

## 2017-06-09 NOTE — Patient Instructions (Addendum)
-   continue ASA and lipitor for stroke prevention - agree with PFO closure procedure. Please follow up with Dr. Avelino LeedsKolwalski for the procedure.  - check BP at home  - keep headache dairy - healthy diet and regular exercise - follow up in 3 months.

## 2017-06-10 LAB — LUPUS ANTICOAGULANT PANEL
DRVVT: 26.2 s (ref 0.0–47.0)
PTT Lupus Anticoagulant: 28.8 s (ref 0.0–51.9)

## 2017-06-10 LAB — PROTEIN C, TOTAL: Protein C, Total: 115 % (ref 60–150)

## 2017-06-10 LAB — PROTEIN S ACTIVITY: Protein S Activity: 96 % (ref 63–140)

## 2017-06-10 LAB — PROTEIN S, TOTAL: Protein S Ag, Total: 87 % (ref 60–150)

## 2017-06-10 LAB — PROTEIN C ACTIVITY: Protein C Activity: 122 % (ref 73–180)

## 2017-06-10 LAB — CARDIOLIPIN ANTIBODIES, IGG, IGM, IGA
Anticardiolipin IgA: <9 APL U/mL (ref 0–11)
Anticardiolipin IgM: 9 MPL U/mL (ref 0–12)

## 2017-06-10 LAB — PROTHROMBIN GENE MUTATION

## 2017-06-10 LAB — BETA-2-GLYCOPROTEIN I ABS, IGG/M/A
Beta-2 Glyco I IgG: <9 GPI IgG units (ref 0–20)
Beta-2-Glycoprotein I IgA: <9 GPI IgA units (ref 0–25)

## 2017-06-10 LAB — FACTOR 5 LEIDEN

## 2017-06-10 LAB — HOMOCYSTEINE: Homocysteine: 7.5 umol/L (ref 0.0–15.0)

## 2017-06-15 DIAGNOSIS — Q2112 Patent foramen ovale: Secondary | ICD-10-CM | POA: Insufficient documentation

## 2017-06-15 DIAGNOSIS — I63511 Cerebral infarction due to unspecified occlusion or stenosis of right middle cerebral artery: Secondary | ICD-10-CM | POA: Insufficient documentation

## 2017-06-16 ENCOUNTER — Ambulatory Visit
Admission: RE | Admit: 2017-06-16 | Discharge: 2017-06-16 | Disposition: A | Discharge status: Home / Self Care | Payer: BC Managed Care – PPO | Source: Ambulatory Visit | Attending: Obstetrics and Gynecology | Admitting: Obstetrics and Gynecology

## 2017-06-16 DIAGNOSIS — Z1231 Encounter for screening mammogram for malignant neoplasm of breast: Secondary | ICD-10-CM | POA: Insufficient documentation

## 2017-07-15 ENCOUNTER — Ambulatory Visit: Payer: Self-pay | Admitting: Neurology

## 2017-08-13 DIAGNOSIS — G43109 Migraine with aura, not intractable, without status migrainosus: Secondary | ICD-10-CM | POA: Insufficient documentation

## 2017-09-14 ENCOUNTER — Ambulatory Visit: Payer: BC Managed Care – PPO | Admitting: Neurology

## 2017-12-17 DIAGNOSIS — I10 Essential (primary) hypertension: Secondary | ICD-10-CM | POA: Insufficient documentation

## 2018-04-20 DIAGNOSIS — E7800 Pure hypercholesterolemia, unspecified: Secondary | ICD-10-CM | POA: Insufficient documentation

## 2018-06-15 ENCOUNTER — Other Ambulatory Visit: Payer: Self-pay | Admitting: Obstetrics and Gynecology

## 2018-06-15 DIAGNOSIS — Z1231 Encounter for screening mammogram for malignant neoplasm of breast: Secondary | ICD-10-CM

## 2018-06-22 ENCOUNTER — Ambulatory Visit: Payer: BC Managed Care – PPO

## 2018-06-29 ENCOUNTER — Ambulatory Visit: Payer: BC Managed Care – PPO

## 2018-07-05 ENCOUNTER — Ambulatory Visit
Admission: RE | Admit: 2018-07-05 | Discharge: 2018-07-05 | Disposition: A | Discharge status: Home / Self Care | Payer: BC Managed Care – PPO | Source: Ambulatory Visit | Attending: Obstetrics and Gynecology | Admitting: Obstetrics and Gynecology

## 2018-07-05 DIAGNOSIS — Z1231 Encounter for screening mammogram for malignant neoplasm of breast: Secondary | ICD-10-CM | POA: Diagnosis not present

## 2018-07-11 IMAGING — MG MM DIGITAL SCREENING BILAT W/ TOMO W/ CAD
8 of 13 series · 8 of 29 positions shown · non-contrast
Comparison: Previous exam(s).

CLINICAL DATA: Screening.

EXAM:
2D DIGITAL SCREENING BILATERAL MAMMOGRAM WITH CAD AND ADJUNCT TOMO

[R CC (1 of 2)]
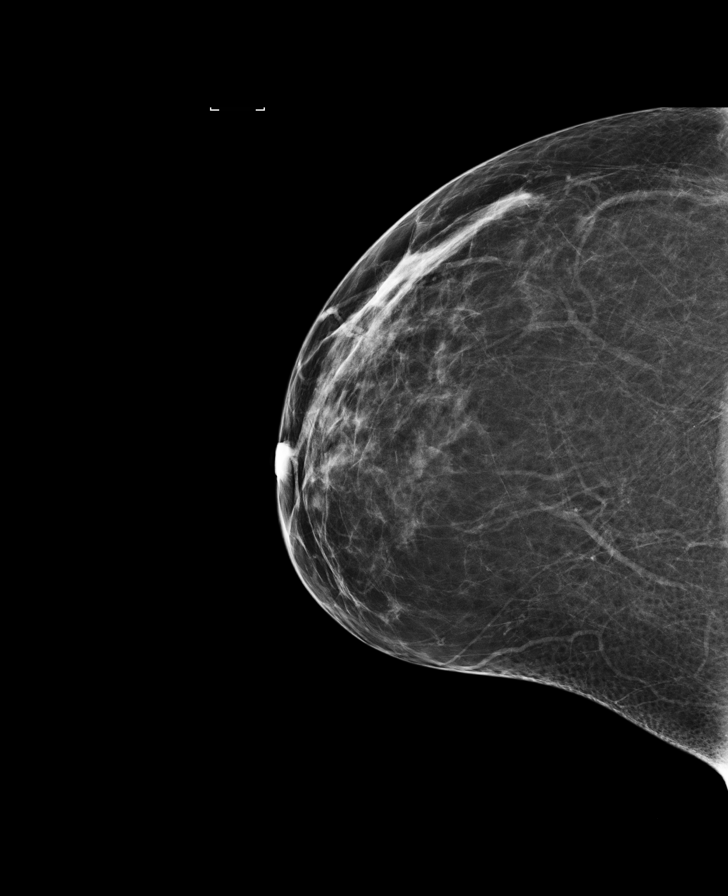

[R MLO synth-2D]
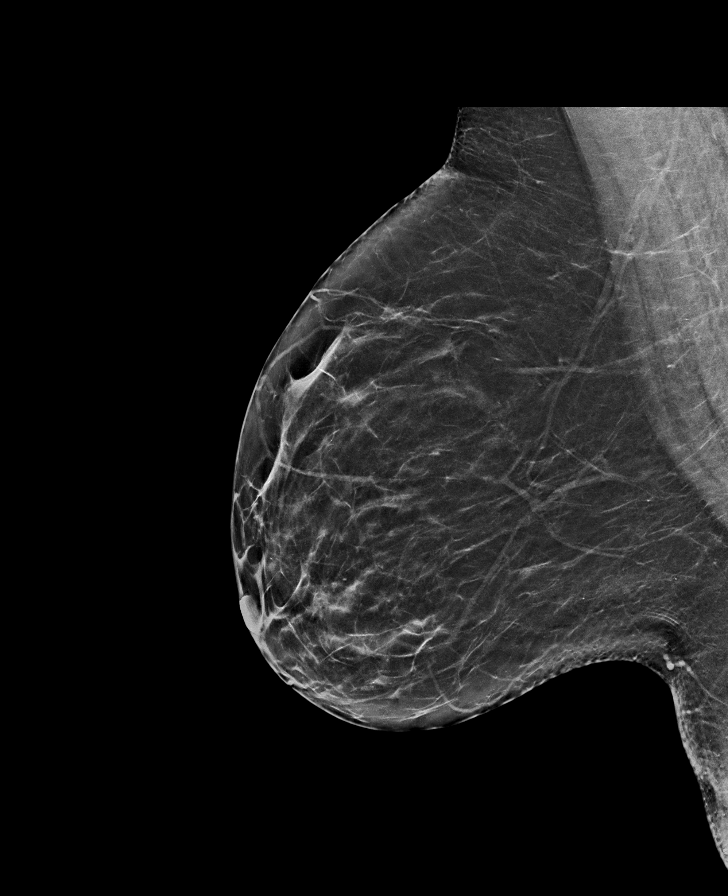

[L CC]
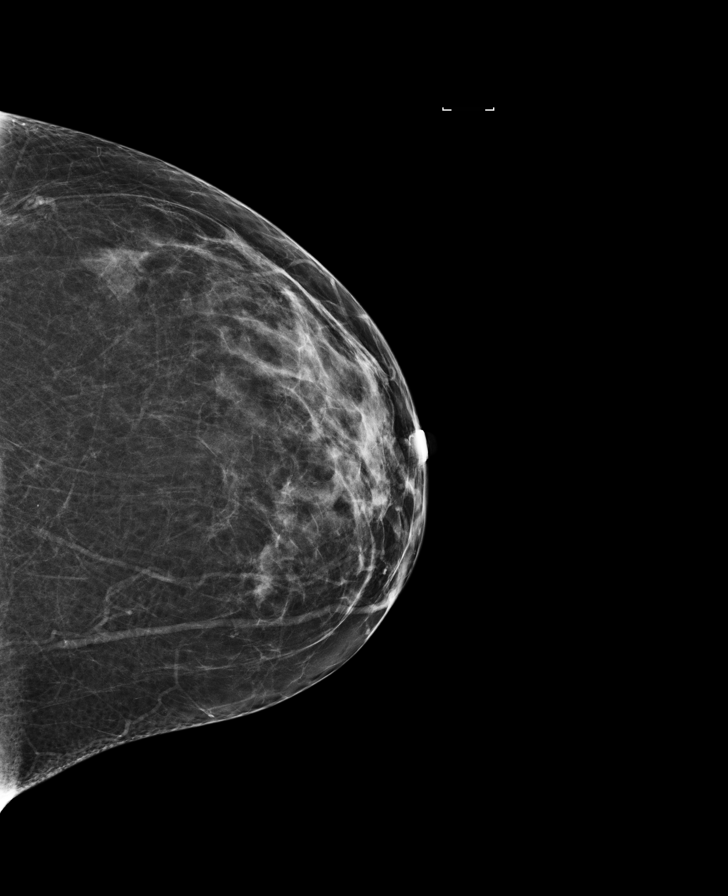

[R MLO]
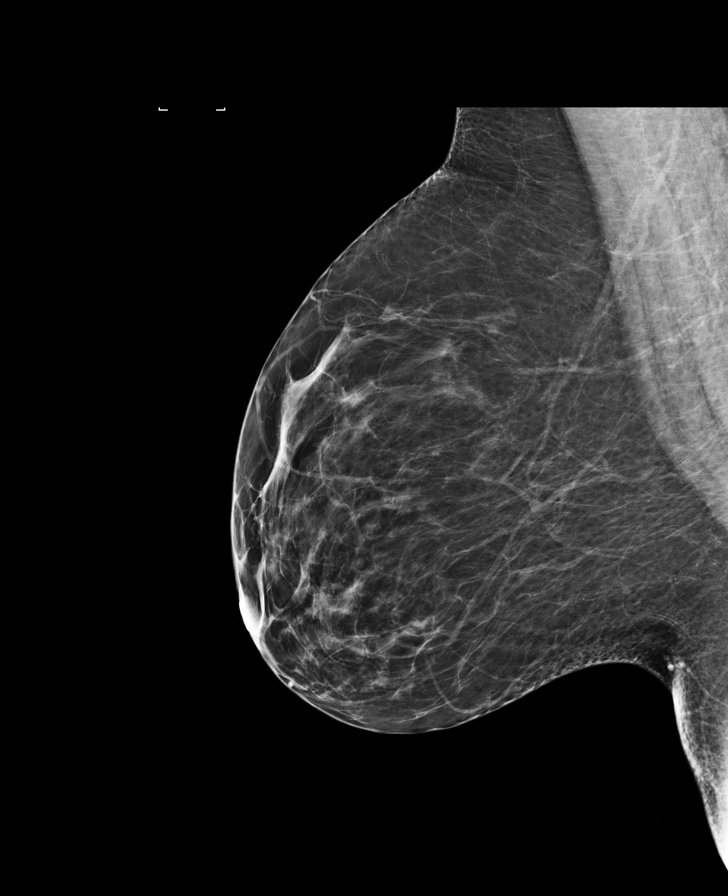

[L MLO synth-2D]
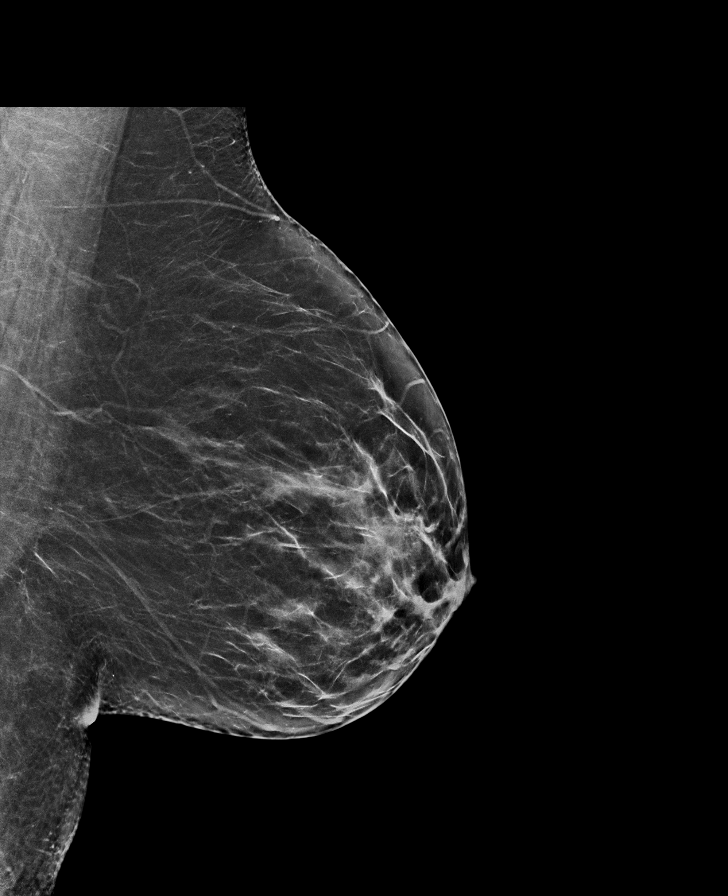

[L CC synth-2D]
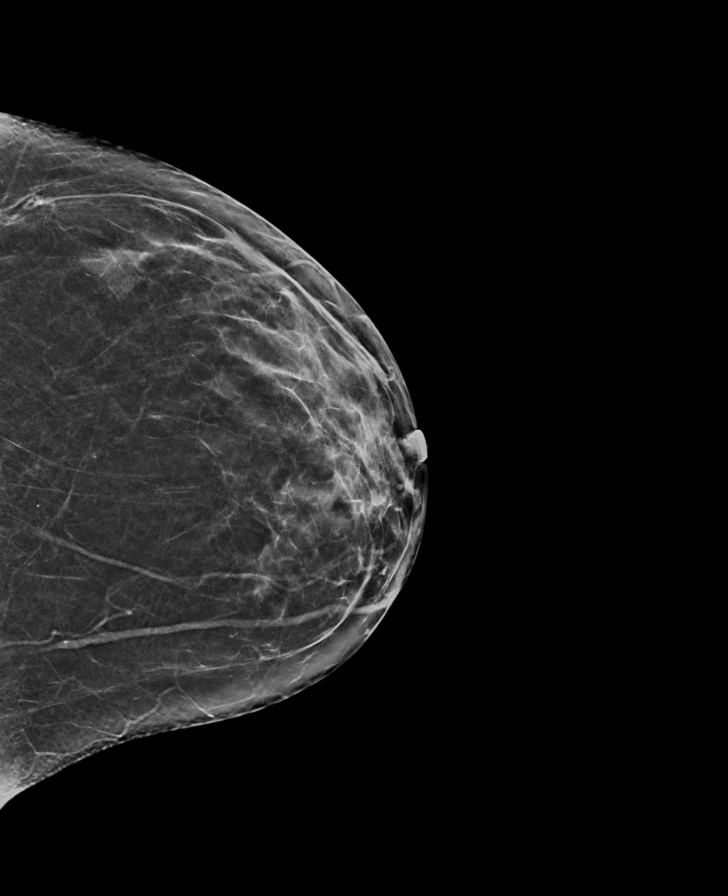

[R CC synth-2D]
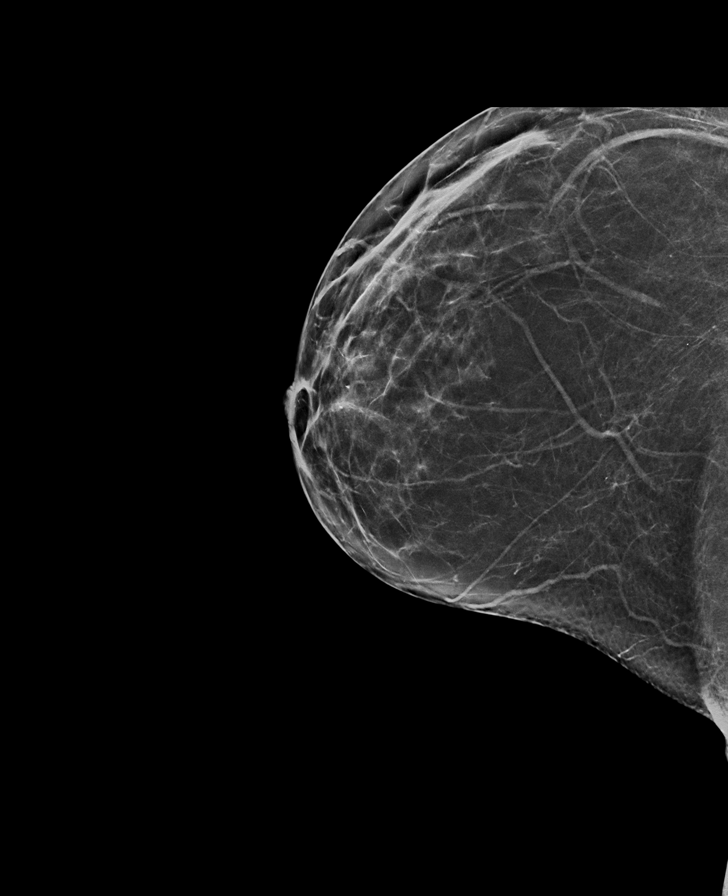

[R CC (2 of 2)]
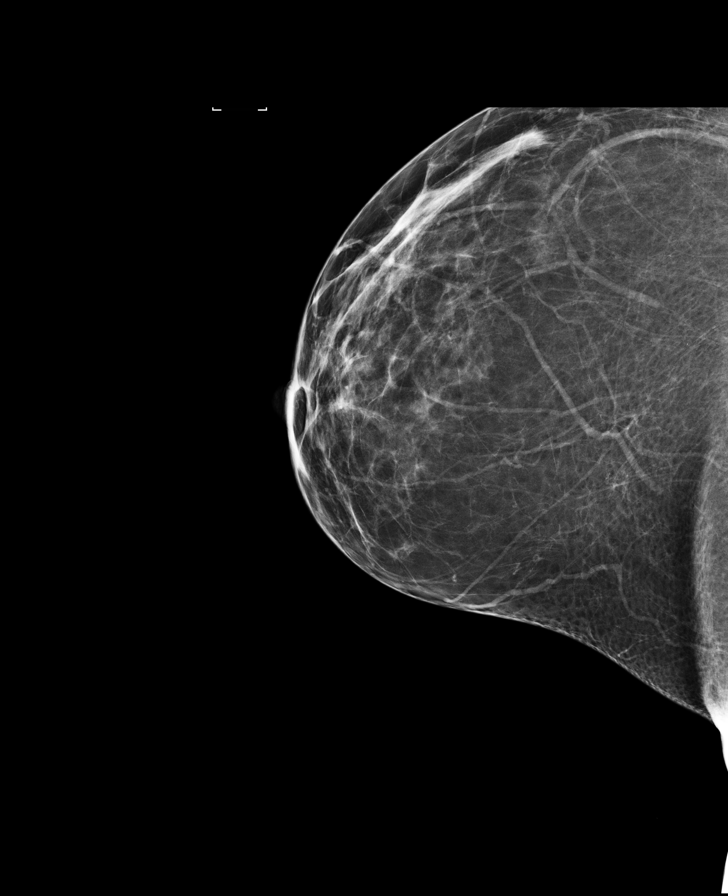

[8 of 29 positions shown; findings below may reference images not displayed]

ACR Breast Density Category b: There are scattered areas of
fibroglandular density.
FINDINGS: There are no findings suspicious for malignancy. Images were
processed with CAD.
IMPRESSION: No mammographic evidence of malignancy. A result letter of this
screening mammogram will be mailed directly to the patient.

RECOMMENDATION:
Screening mammogram in one year. (Code:97-6-RS4)

BI-RADS CATEGORY  1: Negative.

## 2018-12-01 DIAGNOSIS — R202 Paresthesia of skin: Secondary | ICD-10-CM | POA: Diagnosis not present

## 2018-12-16 DIAGNOSIS — F4323 Adjustment disorder with mixed anxiety and depressed mood: Secondary | ICD-10-CM | POA: Insufficient documentation

## 2018-12-24 DIAGNOSIS — R110 Nausea: Secondary | ICD-10-CM | POA: Diagnosis not present

## 2018-12-24 DIAGNOSIS — G43109 Migraine with aura, not intractable, without status migrainosus: Secondary | ICD-10-CM | POA: Diagnosis not present

## 2018-12-24 DIAGNOSIS — I63511 Cerebral infarction due to unspecified occlusion or stenosis of right middle cerebral artery: Secondary | ICD-10-CM | POA: Diagnosis not present

## 2019-01-24 DIAGNOSIS — M5386 Other specified dorsopathies, lumbar region: Secondary | ICD-10-CM | POA: Diagnosis not present

## 2019-01-24 DIAGNOSIS — M9902 Segmental and somatic dysfunction of thoracic region: Secondary | ICD-10-CM | POA: Diagnosis not present

## 2019-01-24 DIAGNOSIS — M9903 Segmental and somatic dysfunction of lumbar region: Secondary | ICD-10-CM | POA: Diagnosis not present

## 2019-01-25 DIAGNOSIS — M9903 Segmental and somatic dysfunction of lumbar region: Secondary | ICD-10-CM | POA: Diagnosis not present

## 2019-01-25 DIAGNOSIS — M5386 Other specified dorsopathies, lumbar region: Secondary | ICD-10-CM | POA: Diagnosis not present

## 2019-01-25 DIAGNOSIS — M9902 Segmental and somatic dysfunction of thoracic region: Secondary | ICD-10-CM | POA: Diagnosis not present

## 2019-07-11 ENCOUNTER — Other Ambulatory Visit: Payer: Self-pay | Admitting: Internal Medicine

## 2019-07-11 DIAGNOSIS — Z1231 Encounter for screening mammogram for malignant neoplasm of breast: Secondary | ICD-10-CM

## 2019-07-22 ENCOUNTER — Ambulatory Visit
Admission: RE | Admit: 2019-07-22 | Discharge: 2019-07-22 | Disposition: A | Discharge status: Home / Self Care | Payer: BC Managed Care – PPO | Source: Ambulatory Visit | Attending: Internal Medicine | Admitting: Internal Medicine

## 2019-07-22 DIAGNOSIS — Z1231 Encounter for screening mammogram for malignant neoplasm of breast: Secondary | ICD-10-CM | POA: Insufficient documentation

## 2019-07-26 ENCOUNTER — Ambulatory Visit: Payer: BC Managed Care – PPO

## 2019-11-25 ENCOUNTER — Encounter: Payer: Self-pay | Admitting: *Deleted

## 2020-01-04 ENCOUNTER — Other Ambulatory Visit: Payer: Self-pay

## 2020-01-04 ENCOUNTER — Encounter: Payer: Self-pay | Admitting: Gastroenterology

## 2020-01-04 ENCOUNTER — Ambulatory Visit (INDEPENDENT_AMBULATORY_CARE_PROVIDER_SITE_OTHER): Payer: BC Managed Care – PPO | Admitting: Gastroenterology

## 2020-01-04 VITALS — BP 118/69 | HR 99 | Temp 97.5°F | Ht 68.0 in | Wt 238.4 lb

## 2020-01-04 DIAGNOSIS — Z1211 Encounter for screening for malignant neoplasm of colon: Secondary | ICD-10-CM | POA: Diagnosis not present

## 2020-01-04 DIAGNOSIS — K5909 Other constipation: Secondary | ICD-10-CM | POA: Diagnosis not present

## 2020-01-04 MED ORDER — PEG 3350-KCL-NA BICARB-NACL 420 G PO SOLR: 4000.0000 mL | Freq: Once | ORAL | 0 refills | Status: AC

## 2020-01-04 MED ORDER — MAGNESIUM CITRATE PO SOLN: 1.0000 | Freq: Once | ORAL | 0 refills | Status: AC

## 2020-01-04 NOTE — Patient Instructions (Signed)

## 2020-01-04 NOTE — Progress Notes (Signed)
Kristina Repress, MD [address]  [address]  [place], [place] [address]  Main: [phone]  Fax: [phone]    Gastroenterology Consultation  Referring Provider:     Marguarite Arbour, MD Primary Care Physician:  Marguarite Arbour, MD Primary Gastroenterologist:  Dr. Arlyss [name] Reason for Consultation: Chronic constipation, rectal bleeding        HPI:   ARIYANNAH [name] is a 49 y.o. female referred by Dr. Judithann Sheen, Duane Lope, MD  for consultation & management of chronic constipation, rectal bleeding.  Patient has history of opiate dependence secondary to chronic ankle pain, has been suffering from chronic constipation secondary to long-term opioid use which was manageable until October last year when it has gotten worse.  She has to take several fiber pills and stool softeners over-the-counter which provides partial relief only.  Associated with significant straining and intermittent bright red blood per rectum.  Patient has been going through significant stress in her personal life, as a single mom, supports her 4 children.  She used to work 4 part-time jobs.  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  She acknowledges that she is a stress eater that has led to significant weight gain within last few months.  Patient is tearful from the suffering and stress she has been going through in her life.  She sees, she has to rely on food stamps does not have access to healthy food.  She does drink sweet tea every day.  In last 2 weeks, she tried to eat more salads and yogurt.  She does not smoke or drink alcohol  NSAIDs: None  Antiplts/Anticoagulants/Anti thrombotics:None  GI Procedures: None She denies family history of GI malignancy Denies having any GI surgeries  Past Medical History:  Diagnosis Date  . 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 Stroke [facility])     Past Surgical History:  Procedure Laterality Date  . ABDOMINAL HYSTERECTOMY     04/13/15  . CHOLECYSTECTOMY    . FRACTURE SURGERY Right    ankle fusion  . LEEP    . SALPINGOOPHORECTOMY Bilateral 04/13/2015   Procedure: SALPINGO OOPHORECTOMY;  Surgeon: Suzy Bouchard, MD;  Location: [facility] ORS;  Service: Gynecology;  Laterality: Bilateral;  . TEE WITHOUT CARDIOVERSION N/A 06/01/2017   Procedure: TRANSESOPHAGEAL ECHOCARDIOGRAM (TEE);  Surgeon: Lamar Blinks, MD;  Location: [facility] ORS;  Service: Cardiovascular;  Laterality: N/A;  . TONSILLECTOMY    . VAGINAL HYSTERECTOMY N/A 04/13/2015   Procedure: HYSTERECTOMY VAGINAL;  Surgeon: Suzy Bouchard, MD;  Location: [facility] ORS;  Service: Gynecology;  Laterality: N/A;     Current Outpatient Medications:  .  amLODipine (NORVASC) 5 MG tablet, Take 5 mg by mouth daily., Disp: , Rfl:  .  aspirin 81 MG tablet, Take 1 tablet (81 mg total) by mouth daily. (Patient taking differently: Take 325 mg by mouth daily. ), Disp: 30 tablet, Rfl: 0 .  atorvastatin (LIPITOR) 40 MG tablet, Take 1 tablet (40 mg total) by mouth daily at 6 PM., Disp: 30 tablet, Rfl: 0 .  bisoprolol-hydrochlorothiazide (ZIAC) 2.5-6.25 MG tablet, TAKE 1 TABLET BY MOUTH EVERY DAY, Disp: , Rfl:  .  clonazePAM (KLONOPIN) 1 MG tablet, Take 1 mg by mouth at bedtime as needed., Disp: , Rfl:  .  docusate sodium (COLACE) 50 MG capsule, Take 1 capsule (50 mg total) by mouth 2 (two) times daily., Disp: 60 capsule, Rfl: 0 .  DULoxetine (CYMBALTA) 30 MG capsule, TAKE 2 CAPSULES (  60 MG TOTAL) BY MOUTH ONCE DAILY, Disp: , Rfl:  .  EMGALITY 120 MG/ML SOAJ, , Disp: , Rfl:  .  gabapentin (NEURONTIN) 100 MG capsule, Take 200 mg by mouth 3 (three) times daily., Disp: , Rfl:  .  Inulin (FIBER CHOICE PO), Take by mouth., Disp: , Rfl:  .  oxyCODONE-acetaminophen (PERCOCET/ROXICET) 5-325 MG per tablet, Take 2 tablets by mouth every 4 (four) hours as needed for moderate pain., Disp: 30 tablet, Rfl: 0 .  phentermine (ADIPEX-P) 37.5 MG tablet, Take 37.5 mg by mouth every morning., Disp:  , Rfl:  .  QUEtiapine (SEROQUEL) 50 MG tablet, Take 50 mg by mouth at bedtime., Disp: , Rfl:  .  Rimegepant Sulfate 75 MG TBDP, Take by mouth., Disp: , Rfl:  .  topiramate (TOPAMAX) 50 MG tablet, Take 50 mg by mouth 2 (two) times daily., Disp: , Rfl:  .  valACYclovir (VALTREX) 500 MG tablet, Take 500 mg by mouth daily., Disp: , Rfl:    Family History  Problem Relation Age of Onset  . Breast cancer Maternal Uncle 30  . Stroke Maternal Grandfather   . Stroke Paternal Grandfather      Social History   Tobacco Use  . Smoking status: Never Smoker  . Smokeless tobacco: Never Used  Substance Use Topics  . Alcohol use: No  . Drug use: No    Allergies as of 01/04/2020  . (No Known Allergies)    Review of Systems:    All systems reviewed and negative except where noted in HPI.   Physical Exam:  BP 118/69 (BP Location: Left Arm, Patient Position: Sitting, Cuff Size: Normal)   Pulse 99   Temp (!) 97.5 F (36.4 C) (Oral)   Ht 5\' 8"  (1.727 m)   Wt 238 lb 6 oz (108.1 kg)   LMP 03/28/2015 (Exact Date)   BMI 36.24 kg/m  Patient's last menstrual period was 03/28/2015 (exact date).  General:   Alert,  Well-developed, well-nourished, pleasant and cooperative in NAD Head:  Normocephalic and atraumatic. Eyes:  Sclera clear, no icterus.   Conjunctiva pink. Ears:  Normal auditory acuity. Nose:  No deformity, discharge, or lesions. Mouth:  No deformity or lesions,oropharynx pink & moist. Neck:  Supple; no masses or thyromegaly. Lungs:  Respirations even and unlabored.  Clear throughout to auscultation.   No wheezes, crackles, or rhonchi. No acute distress. Heart:  Regular rate and rhythm; no murmurs, clicks, rubs, or gallops. Abdomen:  Normal bowel sounds. Soft, obese, non-tender and non-distended without masses, hepatosplenomegaly or hernias noted.  No guarding or rebound tenderness.   Rectal: Not performed Msk: Decreased mobility in her left ankle, scar from prior surgery Pulses:   Normal pulses noted. Extremities:  No clubbing or edema.  No cyanosis. Neurologic:  Alert and oriented x3;  grossly normal neurologically. Skin:  Intact without significant lesions or rashes. No jaundice. Psych:  Alert and cooperative. Normal mood and affect.  Imaging Studies: No recent abdominal imaging  Assessment and Plan:   SHAMYRA FARIAS is a 49 y.o. female with history of CVA secondary to PFO s/p closure, history of left ankle surgery, chronic pain, chronic opioid use, metabolic syndrome, opioid-induced constipation is seen in consultation for worsening of chronic constipation and intermittent rectal bleeding  Opioid-induced constipation with recent weight gain TSH normal, hemoglobin normal Her diet is devoid of fiber Discussed with her about high-fiber diet, eliminate carbonated beverages, sweet tea Adequate intake of water We will try Linzess 290 MCG, samples provided  Fiberchoice samples provided  Rectal bleeding In setting of severe constipation secondary to hemorrhoids  Colon cancer screening Recommend screening colonoscopy   Follow up in 4 to 6 weeks   Kristina Repress, MD

## 2020-01-05 ENCOUNTER — Encounter: Payer: Self-pay | Admitting: Gastroenterology

## 2020-01-10 ENCOUNTER — Other Ambulatory Visit: Payer: Self-pay

## 2020-01-10 ENCOUNTER — Other Ambulatory Visit
Admission: RE | Admit: 2020-01-10 | Discharge: 2020-01-10 | Disposition: A | Discharge status: Home / Self Care | Payer: BC Managed Care – PPO | Source: Ambulatory Visit | Attending: Gastroenterology | Admitting: Gastroenterology

## 2020-01-10 ENCOUNTER — Telehealth: Payer: Self-pay | Admitting: Gastroenterology

## 2020-01-10 DIAGNOSIS — Z20822 Contact with and (suspected) exposure to covid-19: Secondary | ICD-10-CM | POA: Insufficient documentation

## 2020-01-10 DIAGNOSIS — Z01812 Encounter for preprocedural laboratory examination: Secondary | ICD-10-CM | POA: Insufficient documentation

## 2020-01-10 MED ORDER — PEG 3350-KCL-NA BICARB-NACL 420 G PO SOLR: 4000.0000 mL | Freq: Once | ORAL | 0 refills | Status: AC

## 2020-01-10 NOTE — Telephone Encounter (Signed)
Sent medication to the pharmacy  

## 2020-01-10 NOTE — Telephone Encounter (Signed)
Patient has a colonoscopy for 01-12-20 & needs prp called in to the [facility] in [place] where the prp is on back order. Please call patient to let her know it was done.

## 2020-01-11 LAB — SARS CORONAVIRUS 2 (TAT 6-24 HRS): SARS Coronavirus 2: NEGATIVE

## 2020-01-12 ENCOUNTER — Other Ambulatory Visit: Payer: Self-pay

## 2020-01-12 ENCOUNTER — Encounter: Payer: Self-pay | Admitting: Gastroenterology

## 2020-01-12 ENCOUNTER — Ambulatory Visit: Payer: BC Managed Care – PPO

## 2020-01-12 ENCOUNTER — Other Ambulatory Visit: Payer: Self-pay | Admitting: Gastroenterology

## 2020-01-12 ENCOUNTER — Encounter
Admission: RE | Disposition: A | Discharge status: Home / Self Care | Payer: Self-pay | Source: Home / Self Care | Attending: Gastroenterology

## 2020-01-12 ENCOUNTER — Ambulatory Visit
Admission: RE | Admit: 2020-01-12 | Discharge: 2020-01-12 | Disposition: A | Discharge status: Home / Self Care | Payer: BC Managed Care – PPO | Attending: Gastroenterology | Admitting: Gastroenterology

## 2020-01-12 DIAGNOSIS — F419 Anxiety disorder, unspecified: Secondary | ICD-10-CM | POA: Diagnosis not present

## 2020-01-12 DIAGNOSIS — I69398 Other sequelae of cerebral infarction: Secondary | ICD-10-CM | POA: Diagnosis not present

## 2020-01-12 DIAGNOSIS — Z823 Family history of stroke: Secondary | ICD-10-CM | POA: Insufficient documentation

## 2020-01-12 DIAGNOSIS — Z90710 Acquired absence of both cervix and uterus: Secondary | ICD-10-CM | POA: Diagnosis not present

## 2020-01-12 DIAGNOSIS — Z981 Arthrodesis status: Secondary | ICD-10-CM | POA: Insufficient documentation

## 2020-01-12 DIAGNOSIS — Z7982 Long term (current) use of aspirin: Secondary | ICD-10-CM | POA: Insufficient documentation

## 2020-01-12 DIAGNOSIS — Z79899 Other long term (current) drug therapy: Secondary | ICD-10-CM | POA: Insufficient documentation

## 2020-01-12 DIAGNOSIS — Z803 Family history of malignant neoplasm of breast: Secondary | ICD-10-CM | POA: Diagnosis not present

## 2020-01-12 DIAGNOSIS — R519 Headache, unspecified: Secondary | ICD-10-CM | POA: Insufficient documentation

## 2020-01-12 DIAGNOSIS — Z1211 Encounter for screening for malignant neoplasm of colon: Secondary | ICD-10-CM | POA: Insufficient documentation

## 2020-01-12 DIAGNOSIS — I10 Essential (primary) hypertension: Secondary | ICD-10-CM | POA: Diagnosis not present

## 2020-01-12 DIAGNOSIS — K5909 Other constipation: Secondary | ICD-10-CM

## 2020-01-12 DIAGNOSIS — Z9049 Acquired absence of other specified parts of digestive tract: Secondary | ICD-10-CM | POA: Diagnosis not present

## 2020-01-12 HISTORY — DX: Essential (primary) hypertension: I10

## 2020-01-12 HISTORY — DX: Anxiety disorder, unspecified: F41.9

## 2020-01-12 HISTORY — PX: COLONOSCOPY WITH PROPOFOL: SHX5780

## 2020-01-12 SURGERY — COLONOSCOPY WITH PROPOFOL
Anesthesia: General

## 2020-01-12 MED ORDER — PROPOFOL 500 MG/50ML IV EMUL
INTRAVENOUS | Status: AC
  Filled 2020-01-12: qty 50

## 2020-01-12 MED ORDER — PROPOFOL 10 MG/ML IV BOLUS
INTRAVENOUS | Status: DC | PRN
  Administered 2020-01-12: 70 mg via INTRAVENOUS
  Administered 2020-01-12: 30 mg via INTRAVENOUS

## 2020-01-12 MED ORDER — LIDOCAINE HCL (PF) 2 % IJ SOLN
INTRAMUSCULAR | Status: AC
  Filled 2020-01-12: qty 5

## 2020-01-12 MED ORDER — SODIUM CHLORIDE 0.9 % IV SOLN: INTRAVENOUS | Status: DC

## 2020-01-12 MED ORDER — PHENYLEPHRINE HCL (PRESSORS) 10 MG/ML IV SOLN
INTRAVENOUS | Status: AC
  Filled 2020-01-12: qty 1

## 2020-01-12 MED ORDER — LINACLOTIDE 290 MCG PO CAPS: 290.0000 ug | ORAL_CAPSULE | Freq: Every day | ORAL | 2 refills | Status: DC

## 2020-01-12 MED ORDER — PROPOFOL 500 MG/50ML IV EMUL
INTRAVENOUS | Status: DC | PRN
  Administered 2020-01-12: 150 ug/kg/min via INTRAVENOUS

## 2020-01-12 MED ORDER — LIDOCAINE HCL (CARDIAC) PF 100 MG/5ML IV SOSY
PREFILLED_SYRINGE | INTRAVENOUS | Status: DC | PRN
  Administered 2020-01-12: 50 mg via INTRAVENOUS

## 2020-01-12 NOTE — Anesthesia Postprocedure Evaluation (Signed)
Anesthesia Post Note  Patient: Kristina [name]  Procedure(s) Performed: COLONOSCOPY WITH PROPOFOL (N/A )  Patient location during evaluation: PACU Anesthesia Type: General Level of consciousness: awake and alert Pain management: pain level controlled Vital Signs Assessment: post-procedure vital signs reviewed and stable Respiratory status: spontaneous breathing, nonlabored ventilation and respiratory function stable Cardiovascular status: blood pressure returned to baseline and stable Postop Assessment: no apparent nausea or vomiting Anesthetic complications: no     Last Vitals:  Vitals:   01/12/20 0940 01/12/20 0950  BP: 108/89 103/72  Pulse: 79 72  Resp: 17 15  Temp:    SpO2: 100% 100%    Last Pain:  Vitals:   01/12/20 0923  TempSrc: Temporal  PainSc: 0-No pain                 Karleen Hampshire

## 2020-01-12 NOTE — H&P (Signed)
Arlyss Repress, MD [address]  [address]  [place], [place] [address]  Main: [phone]  Fax: [phone] Pager: [phone]  Primary Care Physician:  Marguarite Arbour, MD Primary Gastroenterologist:  Dr. Arlyss Repress  Pre-Procedure History & Physical: HPI:  Kristina [name] is a 49 y.o. female is here for an colonoscopy.   Past Medical History:  Diagnosis Date  . Anxiety   . 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 Hypertension   . Stroke [facility])     Past Surgical History:  Procedure Laterality Date  . ABDOMINAL HYSTERECTOMY     04/13/15  . CHOLECYSTECTOMY    . FRACTURE SURGERY Right    ankle fusion  . LEEP    . SALPINGOOPHORECTOMY Bilateral 04/13/2015   Procedure: SALPINGO OOPHORECTOMY;  Surgeon: Suzy Bouchard, MD;  Location: [facility] ORS;  Service: Gynecology;  Laterality: Bilateral;  . TEE WITHOUT CARDIOVERSION N/A 06/01/2017   Procedure: TRANSESOPHAGEAL ECHOCARDIOGRAM (TEE);  Surgeon: Lamar Blinks, MD;  Location: [facility] ORS;  Service: Cardiovascular;  Laterality: N/A;  . TONSILLECTOMY    . VAGINAL HYSTERECTOMY N/A 04/13/2015   Procedure: HYSTERECTOMY VAGINAL;  Surgeon: Suzy Bouchard, MD;  Location: [facility] ORS;  Service: Gynecology;  Laterality: N/A;    Prior to Admission medications   Medication Sig Start Date End Date Taking? Authorizing Provider  amLODipine (NORVASC) 5 MG tablet Take 5 mg by mouth daily. 09/29/19  Yes [provider]  aspirin 81 MG tablet Take 1 tablet (81 mg total) by mouth daily. Patient taking differently: Take 325 mg by mouth daily.  06/01/17  Yes Enedina Finner, MD  atorvastatin (LIPITOR) 40 MG tablet Take 1 tablet (40 mg total) by mouth daily at 6 PM. 06/01/17  Yes Enedina Finner, MD  bisoprolol-hydrochlorothiazide [facility]) 2.5-6.25 MG tablet TAKE 1 TABLET BY MOUTH EVERY DAY 12/26/18  Yes [provider]  clonazePAM (KLONOPIN) 1 MG tablet Take 1 mg by mouth at bedtime as needed. 11/10/19  Yes [provider]  DULoxetine (CYMBALTA) 30 MG capsule TAKE 2 CAPSULES (60 MG TOTAL) BY MOUTH ONCE DAILY 10/19/19  Yes [provider]  EMGALITY 120 MG/ML SOAJ  01/02/20  Yes [provider]  gabapentin (NEURONTIN) 100 MG capsule Take 200 mg by mouth 3 (three) times daily. 11/05/19  Yes [provider]  Inulin (FIBER CHOICE PO) Take by mouth.   Yes [provider]  linaclotide (LINZESS) 290 MCG CAPS capsule Take 290 mcg by mouth daily before breakfast.   Yes [provider]  oxyCODONE-acetaminophen (PERCOCET/ROXICET) 5-325 MG per tablet Take 2 tablets by mouth every 4 (four) hours as needed for moderate pain. 04/14/15  Yes Schermerhorn, Ihor Austin, MD  phentermine (ADIPEX-P) 37.5 MG tablet Take 37.5 mg by mouth every morning. 12/02/19  Yes [provider]  QUEtiapine (SEROQUEL) 50 MG tablet Take 50 mg by mouth at bedtime. 12/03/19  Yes [provider]  topiramate (TOPAMAX) 50 MG tablet Take 50 mg by mouth 2 (two) times daily. 10/06/19  Yes [provider]  docusate sodium (COLACE) 50 MG capsule Take 1 capsule (50 mg total) by mouth 2 (two) times daily. 04/14/15   Schermerhorn, Ihor Austin, MD  Rimegepant Sulfate 75 MG TBDP Take by mouth. 04/28/19   [provider]  valACYclovir (VALTREX) 500 MG tablet Take 500 mg by mouth daily. 11/06/19   [provider]    Allergies as of 01/04/2020  . (No Known Allergies)    Family History  Problem Relation Age of Onset  . Breast cancer Maternal Uncle 30  . Stroke Maternal Grandfather   . Stroke Paternal Grandfather     Social History   Socioeconomic History  . Marital status: Legally Separated    Spouse name: Not on file  . Number of children: Not on file  . Years of education: Not on file  . Highest education level: Not on file  Occupational History  . Not on file  Tobacco Use  . Smoking status: Never Smoker  . Smokeless tobacco: Never Used  Substance and Sexual  Activity  . Alcohol use: No  . Drug use: No  . Sexual activity: Not Currently  Other Topics Concern  . Not on file  Social History Narrative  . Not on file   Social Determinants of Health   Financial Resource Strain:   . Difficulty of Paying Living Expenses: Not on file  Food Insecurity:   . Worried About Charity fundraiser in the Last Year: Not on file  . Ran Out of Food in the Last Year: Not on file  Transportation Needs:   . Lack of Transportation (Medical): Not on file  . Lack of Transportation (Non-Medical): Not on file  Physical Activity:   . Days of Exercise per Week: Not on file  . Minutes of Exercise per Session: Not on file  Stress:   . Feeling of Stress : Not on file  Social Connections:   . Frequency of Communication with Friends and Family: Not on file  . Frequency of Social Gatherings with Friends and Family: Not on file  . Attends Religious Services: Not on file  . Active Member of Clubs or Organizations: Not on file  . Attends Archivist Meetings: Not on file  . Marital Status: Not on file  Intimate Partner Violence:   . Fear of Current or Ex-Partner: Not on file  . Emotionally Abused: Not on file  . Physically Abused: Not on file  . Sexually Abused: Not on file    Review of Systems: See HPI, otherwise negative ROS  Physical Exam: BP 108/77   Pulse 84   Temp 97.6 F (36.4 C) (Temporal)   Resp 16   Ht 5\' 8"  (1.727 m)   Wt 105.7 kg   LMP 03/28/2015 (Exact Date)   SpO2 99%   BMI 35.43 kg/m  General:   Alert,  pleasant and cooperative in NAD Head:  Normocephalic and atraumatic. Neck:  Supple; no masses or thyromegaly. Lungs:  Clear throughout to auscultation.    Heart:  Regular rate and rhythm. Abdomen:  Soft, nontender and nondistended. Normal bowel sounds, without guarding, and without rebound.   Neurologic:  Alert and  oriented x4;  grossly normal neurologically.  Impression/Plan: Kristina [name] is here for an colonoscopy to be  performed for colon cancer screening  Risks, benefits, limitations, and alternatives regarding  colonoscopy have been reviewed with the patient.  Questions have been answered.  All parties agreeable.   Sherri Sear, MD  01/12/2020, 8:48 AM

## 2020-01-12 NOTE — Transfer of Care (Signed)
Immediate Anesthesia Transfer of Care Note  Patient: Kristina [name]  Procedure(s) Performed: COLONOSCOPY WITH PROPOFOL (N/A )  Patient Location: PACU  Anesthesia Type:General  Level of Consciousness: awake, alert  and oriented  Airway & Oxygen Therapy: Patient Spontanous Breathing and Patient connected to nasal cannula oxygen  Post-op Assessment: Report given to RN and Post -op Vital signs reviewed and stable  Post vital signs: Reviewed and stable  Last Vitals:  Vitals Value Taken Time  BP 93/65 01/12/20 0924  Temp 36.7 C 01/12/20 0923  Pulse 88 01/12/20 0924  Resp 21 01/12/20 0924  SpO2 100 % 01/12/20 0924  Vitals shown include unvalidated device data.  Last Pain:  Vitals:   01/12/20 0923  TempSrc: Temporal  PainSc: 0-No pain         Complications: No apparent anesthesia complications

## 2020-01-12 NOTE — Op Note (Signed)
[facility] Patient Name: Kristina [name] Procedure Date: 01/12/2020 8:40 AM MRN: [id number] Account #: [id number] Date of Birth: [birth year] Admit Type: Outpatient Age: 49 Room: [facility] ENDO ROOM 1 Gender: Female Note Status: Finalized Procedure:             Colonoscopy Indications:           Screening for colorectal malignant neoplasm, This is                         the patient's first colonoscopy Providers:             Lin Landsman MD, MD Referring MD:          Leonie Douglas. Doy Hutching, MD (Referring MD) Medicines:             Monitored Anesthesia Care Complications:         No immediate complications. Estimated blood loss: None. Procedure:             Pre-Anesthesia Assessment:                        - Prior to the procedure, a History and Physical was                         performed, and patient medications and allergies were                         reviewed. The patient is competent. The risks and                         benefits of the procedure and the sedation options and                         risks were discussed with the patient. All questions                         were answered and informed consent was obtained.                         Patient identification and proposed procedure were                         verified by the physician, the nurse, the                         anesthesiologist, the anesthetist and the technician                         in the pre-procedure area in the procedure room in the                         endoscopy suite. Mental Status Examination: alert and                         oriented. Airway Examination: normal oropharyngeal                         airway and neck mobility. Respiratory Examination:  clear to auscultation. CV Examination: normal.                         Prophylactic Antibiotics: The patient does not require                         prophylactic antibiotics. Prior  Anticoagulants: The                         patient has taken no previous anticoagulant or                         antiplatelet agents. ASA Grade Assessment: II - A                         patient with mild systemic disease. After reviewing                         the risks and benefits, the patient was deemed in                         satisfactory condition to undergo the procedure. The                         anesthesia plan was to use monitored anesthesia care                         (MAC). Immediately prior to administration of                         medications, the patient was re-assessed for adequacy                         to receive sedatives. The heart rate, respiratory                         rate, oxygen saturations, blood pressure, adequacy of                         pulmonary ventilation, and response to care were                         monitored throughout the procedure. The physical                         status of the patient was re-assessed after the                         procedure.                        After obtaining informed consent, the colonoscope was                         passed under direct vision. Throughout the procedure,                         the patient's blood pressure, pulse, and oxygen  saturations were monitored continuously. The                         Colonoscope was introduced through the anus and                         advanced to the the cecum, identified by appendiceal                         orifice and ileocecal valve. The colonoscopy was                         performed with moderate difficulty due to the                         patient's body habitus. Successful completion of the                         procedure was aided by increasing the dose of sedation                         medication and applying abdominal pressure. The                         patient tolerated the procedure well. The quality of                          the bowel preparation was evaluated using the BBPS                         [facility] Bowel Preparation Scale) with scores of: Right                         Colon = 3, Transverse Colon = 3 and Left Colon = 3                         (entire mucosa seen well with no residual staining,                         small fragments of stool or opaque liquid). The total                         BBPS score equals 9. Findings:      The perianal and digital rectal examinations were normal. Pertinent       negatives include normal sphincter tone and no palpable rectal lesions.      The entire examined colon appeared normal.      The retroflexed view of the distal rectum and anal verge was normal and       showed no anal or rectal abnormalities. Impression:            - The entire examined colon is normal.                        - The distal rectum and anal verge are normal on                         retroflexion view.                        -  No specimens collected. Recommendation:        - Discharge patient to home (with escort).                        - Resume previous diet and high fiber diet today.                        - Continue present medications.                        - Repeat colonoscopy in 10 years for surveillance.                        - Return to my office PRN. Procedure Code(s):     --- Professional ---                        X1062, Colorectal cancer screening; colonoscopy on                         individual not meeting criteria for high risk Diagnosis Code(s):     --- Professional ---                        Z12.11, Encounter for screening for malignant neoplasm                         of colon CPT copyright 2019 American Medical Association. All rights reserved. The codes documented in this report are preliminary and upon coder review may  be revised to meet current compliance requirements. Dr. Ulyess Mort Lin Landsman MD, MD 01/12/2020 9:20:30 AM This report  has been signed electronically. Number of Addenda: 0 Note Initiated On: 01/12/2020 8:40 AM Scope Withdrawal Time: 0 hours 8 minutes 6 seconds  Total Procedure Duration: 0 hours 20 minutes 40 seconds  Estimated Blood Loss:  Estimated blood loss: none.      [facility]

## 2020-01-12 NOTE — Anesthesia Preprocedure Evaluation (Signed)
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), Residual Symptoms    GI/Hepatic negative GI ROS, Neg liver ROS,   Endo/Other  negative endocrine ROS  Renal/GU negative Renal ROS  negative genitourinary   Musculoskeletal   Abdominal   Peds  Hematology negative hematology ROS (+)   Anesthesia Other Findings Past Medical History: No date: Anxiety 05/05/2014: HSV-1 (herpes simplex virus 1) infection No date: Hypertension No date: Stroke [facility])  Past Surgical History: No date: ABDOMINAL HYSTERECTOMY     Comment:  04/13/15 No date: CHOLECYSTECTOMY No date: FRACTURE SURGERY; Right     Comment:  ankle fusion No date: LEEP 04/13/2015: SALPINGOOPHORECTOMY; Bilateral     Comment:  Procedure: SALPINGO OOPHORECTOMY;  Surgeon: Suzy Bouchard, MD;  Location: [facility] ORS;  Service:               Gynecology;  Laterality: Bilateral; 06/01/2017: TEE WITHOUT CARDIOVERSION; N/A     Comment:  Procedure: TRANSESOPHAGEAL ECHOCARDIOGRAM (TEE);                Surgeon: Lamar Blinks, MD;  Location: [facility] ORS;                Service: Cardiovascular;  Laterality: N/A; No date: TONSILLECTOMY 04/13/2015: VAGINAL HYSTERECTOMY; N/A     Comment:  Procedure: HYSTERECTOMY VAGINAL;  Surgeon: Suzy Bouchard, MD;  Location: [facility] ORS;  Service:               Gynecology;  Laterality: N/A;  BMI    Body Mass Index: 35.43 kg/m      Reproductive/Obstetrics negative OB ROS                              Anesthesia Physical Anesthesia Plan  ASA: II  Anesthesia Plan: General   Post-op Pain Management:    Induction:   PONV Risk Score and Plan: Propofol infusion and TIVA  Airway Management Planned: Natural Airway and Nasal Cannula  Additional Equipment:   Intra-op Plan:   Post-operative Plan:   Informed Consent: I have reviewed the patients History and Physical, chart, labs and discussed the procedure including the risks, benefits and alternatives for the proposed anesthesia with the patient or authorized representative who has indicated his/her understanding and acceptance.     Dental Advisory Given  Plan Discussed with: Anesthesiologist  Anesthesia Plan Comments:         Anesthesia Quick Evaluation

## 2020-01-13 ENCOUNTER — Encounter: Payer: Self-pay | Admitting: *Deleted

## 2020-03-07 ENCOUNTER — Encounter: Payer: Self-pay | Admitting: *Deleted

## 2020-04-04 ENCOUNTER — Ambulatory Visit: Payer: BC Managed Care – PPO | Admitting: Gastroenterology

## 2020-04-08 ENCOUNTER — Other Ambulatory Visit: Payer: Self-pay | Admitting: Gastroenterology

## 2020-04-08 DIAGNOSIS — K5909 Other constipation: Secondary | ICD-10-CM

## 2020-05-05 ENCOUNTER — Other Ambulatory Visit: Payer: Self-pay | Admitting: Gastroenterology

## 2020-05-05 DIAGNOSIS — K5909 Other constipation: Secondary | ICD-10-CM

## 2020-05-29 ENCOUNTER — Other Ambulatory Visit: Payer: Self-pay | Admitting: Gastroenterology

## 2020-05-29 DIAGNOSIS — K5909 Other constipation: Secondary | ICD-10-CM

## 2020-05-29 NOTE — Telephone Encounter (Signed)
Last office visit 01/04/2020 Chronic constipation  Last refill 05/07/2020 Sent mychart message informing patient she is due for a appointment

## 2020-06-28 ENCOUNTER — Other Ambulatory Visit: Payer: Self-pay | Admitting: Gastroenterology

## 2020-06-28 DIAGNOSIS — K5909 Other constipation: Secondary | ICD-10-CM

## 2020-06-28 NOTE — Telephone Encounter (Signed)
Last office visit 01/04/2020 chronic constipation  Last refill 05/29/2020 0 refills  No appointment is scheduled

## 2020-08-02 ENCOUNTER — Other Ambulatory Visit: Payer: Self-pay | Admitting: Internal Medicine

## 2020-08-02 DIAGNOSIS — Z1231 Encounter for screening mammogram for malignant neoplasm of breast: Secondary | ICD-10-CM

## 2020-08-03 ENCOUNTER — Other Ambulatory Visit: Payer: Self-pay | Admitting: Gastroenterology

## 2020-08-03 DIAGNOSIS — K5909 Other constipation: Secondary | ICD-10-CM

## 2020-11-02 ENCOUNTER — Other Ambulatory Visit: Payer: Self-pay

## 2020-11-02 ENCOUNTER — Ambulatory Visit
Admission: RE | Admit: 2020-11-02 | Discharge: 2020-11-02 | Disposition: A | Discharge status: Home / Self Care | Payer: BC Managed Care – PPO | Source: Ambulatory Visit | Attending: Internal Medicine | Admitting: Internal Medicine

## 2020-11-02 DIAGNOSIS — Z1231 Encounter for screening mammogram for malignant neoplasm of breast: Secondary | ICD-10-CM

## 2020-11-19 ENCOUNTER — Ambulatory Visit: Payer: Self-pay | Attending: Internal Medicine

## 2020-11-19 DIAGNOSIS — Z23 Encounter for immunization: Secondary | ICD-10-CM

## 2020-11-19 NOTE — Progress Notes (Signed)
   Covid-19 Vaccination Clinic  Name:  Kristina [name]    MRN: [id number] DOB: 05-Mar-1971  11/19/2020  Kristina [name] was observed post Covid-19 immunization for 15 minutes without incident. She was provided with Vaccine Information Sheet and instruction to access the V-Safe system.   Kristina [name] was instructed to call 911 with any severe reactions post vaccine: Marland Kitchen Difficulty breathing  . Swelling of face and throat  . A fast heartbeat  . A bad rash all over body  . Dizziness and weakness   Immunizations Administered    Name Date Dose VIS Date Route   Pfizer COVID-19 Vaccine 11/19/2020  3:29 PM 0.3 mL 09/05/2020 Intramuscular   Manufacturer: [employer], [employer]   Lot: [id number]   NDC: [address]-4

## 2021-11-08 ENCOUNTER — Other Ambulatory Visit: Payer: Self-pay | Admitting: Internal Medicine

## 2021-11-08 DIAGNOSIS — Z1231 Encounter for screening mammogram for malignant neoplasm of breast: Secondary | ICD-10-CM

## 2021-11-12 ENCOUNTER — Other Ambulatory Visit: Payer: Self-pay

## 2021-11-12 ENCOUNTER — Ambulatory Visit
Admission: RE | Admit: 2021-11-12 | Discharge: 2021-11-12 | Disposition: A | Discharge status: Home / Self Care | Payer: BC Managed Care – PPO | Source: Ambulatory Visit | Attending: Internal Medicine | Admitting: Internal Medicine

## 2021-11-12 DIAGNOSIS — Z1231 Encounter for screening mammogram for malignant neoplasm of breast: Secondary | ICD-10-CM | POA: Diagnosis present

## 2022-09-02 ENCOUNTER — Other Ambulatory Visit: Payer: Self-pay | Admitting: Internal Medicine

## 2022-09-02 DIAGNOSIS — Z1231 Encounter for screening mammogram for malignant neoplasm of breast: Secondary | ICD-10-CM

## 2022-12-01 ENCOUNTER — Ambulatory Visit
Admission: RE | Admit: 2022-12-01 | Discharge: 2022-12-01 | Disposition: A | Discharge status: Home / Self Care | Payer: BC Managed Care – PPO | Source: Ambulatory Visit | Attending: Internal Medicine | Admitting: Internal Medicine

## 2022-12-01 DIAGNOSIS — Z1231 Encounter for screening mammogram for malignant neoplasm of breast: Secondary | ICD-10-CM | POA: Insufficient documentation

## 2024-04-20 ENCOUNTER — Other Ambulatory Visit: Payer: Self-pay | Admitting: Obstetrics and Gynecology

## 2024-04-20 DIAGNOSIS — Z1231 Encounter for screening mammogram for malignant neoplasm of breast: Secondary | ICD-10-CM

## 2024-04-28 ENCOUNTER — Ambulatory Visit
Admission: RE | Admit: 2024-04-28 | Discharge: 2024-04-28 | Disposition: A | Discharge status: Home / Self Care | Payer: Self-pay | Source: Ambulatory Visit | Attending: Obstetrics and Gynecology | Admitting: Obstetrics and Gynecology

## 2024-04-28 DIAGNOSIS — Z1231 Encounter for screening mammogram for malignant neoplasm of breast: Secondary | ICD-10-CM | POA: Insufficient documentation

## 2024-05-11 ENCOUNTER — Other Ambulatory Visit: Payer: Self-pay | Admitting: Medical Genetics

## 2024-05-17 ENCOUNTER — Other Ambulatory Visit
Admission: RE | Admit: 2024-05-17 | Discharge: 2024-05-17 | Disposition: A | Discharge status: Home / Self Care | Payer: Self-pay | Source: Ambulatory Visit | Attending: Medical Genetics | Admitting: Medical Genetics

## 2024-05-31 LAB — GENECONNECT MOLECULAR SCREEN: Genetic Analysis Overall Interpretation: NEGATIVE
